# Patient Record
Sex: Male | Born: 1965
Health system: Southern US, Community
[De-identification: ages and names within clinical notes are randomized; demographics above are authoritative.]

## PROBLEM LIST (undated history)

## (undated) DIAGNOSIS — G8929 Other chronic pain: Secondary | ICD-10-CM

## (undated) DIAGNOSIS — Z860100 Personal history of colon polyps, unspecified: Secondary | ICD-10-CM

## (undated) DIAGNOSIS — M549 Dorsalgia, unspecified: Secondary | ICD-10-CM

## (undated) DIAGNOSIS — F411 Generalized anxiety disorder: Secondary | ICD-10-CM

## (undated) DIAGNOSIS — Z973 Presence of spectacles and contact lenses: Secondary | ICD-10-CM

## (undated) DIAGNOSIS — K5090 Crohn's disease, unspecified, without complications: Secondary | ICD-10-CM

## (undated) DIAGNOSIS — Z8719 Personal history of other diseases of the digestive system: Secondary | ICD-10-CM

## (undated) DIAGNOSIS — F32A Depression, unspecified: Secondary | ICD-10-CM

## (undated) DIAGNOSIS — L309 Dermatitis, unspecified: Secondary | ICD-10-CM

## (undated) DIAGNOSIS — D494 Neoplasm of unspecified behavior of bladder: Secondary | ICD-10-CM

## (undated) DIAGNOSIS — D860 Sarcoidosis of lung: Secondary | ICD-10-CM

## (undated) HISTORY — PX: BRONCHOSCOPY: SUR163

## (undated) HISTORY — DX: Crohn's disease, unspecified, without complications: K50.90

## (undated) HISTORY — PX: COLONOSCOPY: SHX174

## (undated) HISTORY — DX: Depression, unspecified: F32.A

---

## 2002-05-01 ENCOUNTER — Inpatient Hospital Stay (HOSPITAL_COMMUNITY): Admission: EM | Admit: 2002-05-01 | Discharge: 2002-05-03 | Payer: Self-pay | Admitting: Gastroenterology

## 2002-05-03 ENCOUNTER — Encounter: Payer: Self-pay | Admitting: Gastroenterology

## 2008-03-06 ENCOUNTER — Encounter: Payer: Self-pay | Admitting: Internal Medicine

## 2008-03-08 ENCOUNTER — Ambulatory Visit: Payer: Self-pay | Admitting: Internal Medicine

## 2008-03-08 DIAGNOSIS — R599 Enlarged lymph nodes, unspecified: Secondary | ICD-10-CM | POA: Insufficient documentation

## 2008-03-08 DIAGNOSIS — J329 Chronic sinusitis, unspecified: Secondary | ICD-10-CM | POA: Insufficient documentation

## 2008-03-08 DIAGNOSIS — R21 Rash and other nonspecific skin eruption: Secondary | ICD-10-CM | POA: Insufficient documentation

## 2008-03-15 ENCOUNTER — Ambulatory Visit: Payer: Self-pay | Admitting: Internal Medicine

## 2008-03-15 DIAGNOSIS — F17200 Nicotine dependence, unspecified, uncomplicated: Secondary | ICD-10-CM | POA: Insufficient documentation

## 2008-03-15 LAB — CONVERTED CEMR LAB
ALT: 26 units/L (ref 0–53)
AST: 20 units/L (ref 0–37)
Albumin: 4.2 g/dL (ref 3.5–5.2)
Alkaline Phosphatase: 172 units/L — ABNORMAL HIGH (ref 39–117)
Angiotensin 1 Converting Enzyme: 74 units/L — ABNORMAL HIGH (ref 9–67)
BUN: 9 mg/dL (ref 6–23)
Basophils Absolute: 0 10*3/uL (ref 0.0–0.1)
Basophils Relative: 0.8 % (ref 0.0–3.0)
Bilirubin, Direct: 0.1 mg/dL (ref 0.0–0.3)
CO2: 28 meq/L (ref 19–32)
Calcium: 9.6 mg/dL (ref 8.4–10.5)
Chloride: 104 meq/L (ref 96–112)
Creatinine, Ser: 0.9 mg/dL (ref 0.4–1.5)
Direct LDL: 128.2 mg/dL
Eosinophils Absolute: 0.2 10*3/uL (ref 0.0–0.7)
Eosinophils Relative: 3.8 % (ref 0.0–5.0)
GFR calc Af Amer: 120 mL/min
GFR calc non Af Amer: 99 mL/min
Glucose, Bld: 91 mg/dL (ref 70–99)
HCT: 38.9 % — ABNORMAL LOW (ref 39.0–52.0)
Hemoglobin: 13.8 g/dL (ref 13.0–17.0)
INR: 1.2 — ABNORMAL HIGH (ref 0.8–1.0)
Lymphocytes Relative: 15.1 % (ref 12.0–46.0)
MCHC: 35.4 g/dL (ref 30.0–36.0)
MCV: 86.8 fL (ref 78.0–100.0)
Monocytes Absolute: 0.4 10*3/uL (ref 0.1–1.0)
Monocytes Relative: 7.7 % (ref 3.0–12.0)
Neutro Abs: 3.5 10*3/uL (ref 1.4–7.7)
Neutrophils Relative %: 72.6 % (ref 43.0–77.0)
Platelets: 287 10*3/uL (ref 150–400)
Potassium: 4.1 meq/L (ref 3.5–5.1)
Prothrombin Time: 13.8 s — ABNORMAL HIGH (ref 10.9–13.3)
RBC: 4.48 M/uL (ref 4.22–5.81)
RDW: 12.4 % (ref 11.5–14.6)
Sed Rate: 47 mm/hr — ABNORMAL HIGH (ref 0–16)
Sodium: 137 meq/L (ref 135–145)
Total Bilirubin: 0.7 mg/dL (ref 0.3–1.2)
Total Protein: 8.7 g/dL — ABNORMAL HIGH (ref 6.0–8.3)
WBC: 4.8 10*3/uL (ref 4.5–10.5)
aPTT: 33.7 s — ABNORMAL HIGH (ref 21.7–29.8)

## 2008-03-18 ENCOUNTER — Telehealth (INDEPENDENT_AMBULATORY_CARE_PROVIDER_SITE_OTHER): Payer: Self-pay | Admitting: *Deleted

## 2008-03-19 ENCOUNTER — Ambulatory Visit: Payer: Self-pay | Admitting: Cardiology

## 2008-03-19 ENCOUNTER — Ambulatory Visit: Payer: Self-pay | Admitting: Internal Medicine

## 2008-03-22 ENCOUNTER — Ambulatory Visit: Admission: RE | Admit: 2008-03-22 | Discharge: 2008-03-22 | Payer: Self-pay | Admitting: Internal Medicine

## 2008-03-22 ENCOUNTER — Ambulatory Visit: Payer: Self-pay | Admitting: Internal Medicine

## 2008-03-22 ENCOUNTER — Encounter: Payer: Self-pay | Admitting: Internal Medicine

## 2008-03-27 ENCOUNTER — Ambulatory Visit: Payer: Self-pay | Admitting: Internal Medicine

## 2008-03-27 DIAGNOSIS — D869 Sarcoidosis, unspecified: Secondary | ICD-10-CM | POA: Insufficient documentation

## 2008-05-13 ENCOUNTER — Ambulatory Visit: Payer: Self-pay | Admitting: Internal Medicine

## 2008-05-29 ENCOUNTER — Telehealth: Payer: Self-pay | Admitting: Internal Medicine

## 2008-09-26 ENCOUNTER — Telehealth (INDEPENDENT_AMBULATORY_CARE_PROVIDER_SITE_OTHER): Payer: Self-pay | Admitting: *Deleted

## 2008-09-27 ENCOUNTER — Ambulatory Visit: Payer: Self-pay | Admitting: Internal Medicine

## 2008-10-07 ENCOUNTER — Telehealth: Payer: Self-pay | Admitting: Internal Medicine

## 2009-01-20 ENCOUNTER — Ambulatory Visit: Payer: Self-pay | Admitting: Gastroenterology

## 2009-02-18 ENCOUNTER — Telehealth: Payer: Self-pay | Admitting: Gastroenterology

## 2009-02-19 ENCOUNTER — Ambulatory Visit: Payer: Self-pay | Admitting: Gastroenterology

## 2009-02-19 DIAGNOSIS — K5090 Crohn's disease, unspecified, without complications: Secondary | ICD-10-CM

## 2009-02-19 HISTORY — DX: Crohn's disease, unspecified, without complications: K50.90

## 2009-02-20 ENCOUNTER — Telehealth: Payer: Self-pay | Admitting: Gastroenterology

## 2009-03-05 ENCOUNTER — Telehealth: Payer: Self-pay | Admitting: Gastroenterology

## 2009-03-07 ENCOUNTER — Telehealth (INDEPENDENT_AMBULATORY_CARE_PROVIDER_SITE_OTHER): Payer: Self-pay | Admitting: *Deleted

## 2010-11-17 NOTE — Consult Note (Signed)
NAMEZAION, HREHA                  ACCOUNT NO.:  [id number]   MEDICAL RECORD NO.:  [id number]          PATIENT TYPE:  AMB   LOCATION:  CARD                         FACILITY:  [facility]   PHYSICIAN:  Clinton D. Annamaria Boots, MD, FCCP, FACPDATE OF BIRTH:  08-25-67   DATE OF CONSULTATION:  03/22/2008  DATE OF DISCHARGE:                                 CONSULTATION   INDICATION FOR PROCEDURE:  A 45 year old smoker with persistent cough.  Chest x-ray evidence of mediastinal and hilar adenopathy.  Reticular  nodular parenchymal infiltrates with elevated ACE level.  Bronchoscopy  is performed to establish diagnosis of suspected sarcoid and to rule out  additional diagnoses.  Detailed H&P is attached to the record.   DESCRIPTION OF PROCEDURE:  After fully informed consent, bronchoscopy  was performed on an outpatient basis in the endoscopy suite.  Because of  old nasal trauma, an oral bite block was used.  Oxygen was provided and  titrated to hold saturations 90-98%.  EKG showed sinus rhythm and sinus  tachycardia to 126.  A cumulative dose of 6 mg of Versed and 50 mg of  intravenous fentanyl was used for additional sedation and cough control  providing minimal sedation for this pleasant but active and talkative  gentleman.  The upper airway is anesthetized topically with 2%  Xylocaine.  A fiberoptic videoscope was advanced via the oral appliance  without difficulty.  Cords, trachea and carina look normal.  Sequential  examination was performed bilaterally to the fourth division level with  no endobronchial lesions seen.  Minimal bronchitis may have been present  but any changes were very slight.  Secretions were clear and mucoid.  With fluoroscopic guidance, the bronchoscope was directed into the right  upper lobe where washing, brushing and then transbronchial lung biopsies  were obtained by standard technique from the anterior and posterior  segments.  A biopsy sample was also obtained from the  lingula of the  left lung.  There were no apparent complications.  He was alert and  conversant following the procedure, asking appropriate questions.  He  will be held until stable and then released home with family to  outpatient followup.   FINAL IMPRESSION:  Findings consistent with sarcoid but nonspecific  pending biopsy return.      Clinton D. Annamaria Boots, MD, Shade Flood, FACP  Electronically Signed     CDY/MEDQ  D:  03/22/2008  T:  03/25/2008  Job:  [id number]   cc:   Richardson Landry A. Everlene Farrier, M.D.  Fax: [phone]

## 2010-11-20 NOTE — H&P (Signed)
NAME:  Derrick Herrera, Derrick Herrera                            ACCOUNT NO.:  [id number]   MEDICAL RECORD NO.:  [id number]                   PATIENT TYPE:  INP   LOCATION:  2878                                 FACILITY:  [facility]   PHYSICIAN:  Malcolm T. Fuller Plan, M.D. [facility]          DATE OF BIRTH:  14-May-1966   DATE OF ADMISSION:  05/01/2002  DATE OF DISCHARGE:                                HISTORY & PHYSICAL   CHIEF COMPLAINT:  Rectal bleeding.   HISTORY OF PRESENT ILLNESS:  The patient is a pleasant 45 year old white  male with a history of Crohn's disease, initially diagnosed in 45. He was  at that time found to have an abnormal ileocecal valve on colonoscopy per  Dr. Fuller Plan. Biopsies were consistent with Crohn's disease. He also was  hospitalized in 1995 with an acute lower GI bleed and underwent repeat  colonoscopy at that time, which was unremarkable with the exception of a  stenotic ileocecal valve. He then had subsequent small bowel follow through  with findings consistent with Crohn's involvement of the distal ileum and a  probable ulceration and a long segment of involvement extending  approximately 15 cm within the ileum. The patient did not require  transfusion with that bleed. He has been seen sporadically per Dr. Arnoldo Morale  since that time and has not been seen by Dr. Fuller Plan since 1995. The patient  had remained on a maintenance dose of Pentasa, generally six tablets per  day, but says that some days he does not get that much in.  He says he had  been doing well recently without any obvious Crohn's symptomatology. He says  occasionally he gets a little abdominal gas or cramping, but this has not  been on any sort of regular basis. He says his bowel movements have been  normal. His energy level  has been good, appetite fine and his weight has  been stable. Interestingly he is trying to quit  smoking  and has been off  nicotine over the past few days. He is not taking any regular aspirin or  NSAIDS.   The patient had abrupt onset yesterday afternoon at about 3:30 p.m. with  bloody bowel movements. He says he started passing bright red blood and  clots per rectum without any obvious stool. He had 8 to 10 episodes after  onset, the last one about 11:30 this morning. He was seen initially by Dr.  Arnoldo Morale, and found to have a hemoglobin of 11.4, and then referred to GI for  urgent evaluation. He was seen and evaluated in the office by Dr. Fuller Plan and  found to be hemodynamically stable, but with bright red blood on rectal  exam. He is admitted to the hospital for observation, transfusion as  indicated and further diagnostic  evaluation. Suspicion at this time is that  of a colonic or ileal ulcer with hemorrhage secondary to  reactivation of  his  Crohn's.   CURRENT MEDICATIONS:  Pentasa 250 mg 2 p.o. t.i.d.  No other regular  medicines.   ALLERGIES:  No known drug allergies.   PAST MEDICAL HISTORY:  Benign other than Crohn's disease. He has not had any  other prior surgeries or hospitalizations.   SOCIAL HISTORY:  The patient is single. 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 He is a smoker, trying to quit over the  past several  days. No regular ETOH.   FAMILY HISTORY:  Pertinent for a grandmother with polyps. Mother with  ulcerative colitis and one uncle with colon polyps.   REVIEW OF SYSTEMS:  Reviewed and entirely negative other than GI as outlined  above.   PHYSICAL EXAMINATION:  GENERAL:  A well developed, healthy appearing 56-year-  old white male in  no acute distress. Alert and oriented x 3. Weight 198.  SKIN:  Warm and dry.  VITAL SIGNS:  Temperature 98.6, blood pressure 124/80, pulse 92.  HEENT:  Normocephalic, atraumatic.  EOMI, PERRLA.  Sclerae anicteric.  NECK:  Supple without nodes.  CARDIOVASCULAR:  Slightly tachy, regular rhythm with S1 and S2, no murmurs,  rubs, gallops.  PULMONARY:  Clear to auscultation and percussion.   ABDOMEN:  Soft, nontender. There is no palpable mass or hepatosplenomegaly.  RECTAL:  Per Dr. Fuller Plan revealed a small amount of  frank blood, no lesion  noted.  EXTREMITIES:  Without cyanosis, clubbing or edema.  NEUROLOGIC:  Grossly nonfocal.   LABORATORY DATA:  Laboratory studies are pending. Hemoglobin earlier today  11.4. Labs reviewed from July 2003 done per Dr. Arnoldo Morale showing a hemoglobin  of 13.5, hematocrit 41.1, MCV 80.6, platelet count 211, white count at that  time was 2.4. Liver function studies normal. Alkaline phosphatase 119. TSH  0.79.   IMPRESSION:  44. A 45 year old male with a history of Chron's ileitis with acute lower GI     bleed, rule out colonic or ileal ulceration secondary to inflammatory     bowel disease with hemorrhage. Consider Meckel's diverticulum.  2. History of acute lower GI bleed in 1995 with active Crohn's of the     terminal ileum, but no discrete lesion found to account for bleeding at     that time.   PLAN:  The patient is admitted to the service of Dr. Lucio Edward. He will  be placed on IV fluids, serial H&Hs, transfuse as indicated for hemoglobin  less than  9. Will check baseline labs. Plan colonoscopy within the next 24  hours. If this is unremarkable will need a small bowel follow through  and a  Meckel scan. For details please see the orders.     Amy Esterwood, P.A.-C. [place] T. Fuller Plan, M.D. [facility]    AE/MEDQ  D:  05/01/2002  T:  05/01/2002  Job:  [id number]   cc:   Dr. Fritzi Mandes

## 2010-11-20 NOTE — Discharge Summary (Signed)
NAME:  OSBORNE, SERIO                            ACCOUNT NO.:  [id number]   MEDICAL RECORD NO.:  [id number]                   PATIENT TYPE:  INP   LOCATION:  6962                                 FACILITY:  [facility]   PHYSICIAN:  Sandy Salaam. Deatra Ina, M.D. [facility]          DATE OF BIRTH:  08/24/1965   DATE OF ADMISSION:  05/01/2002  DATE OF DISCHARGE:  05/03/2002                                 DISCHARGE SUMMARY   ADMISSION DIAGNOSES:  61. A 45 year old white male with a history of Crohn's ileitis with acute     lower gastrointestinal bleed, rule out colonic or ileal ulceration     secondary to inflammatory bowel disease with hemorrhage, consider     Meckel's diverticulum.  2. History of acute lower gastrointestinal bleed in 1995, with active     Crohn's of the terminal ileum, but no discrete lesion found to account     for bleeding at that time.   DISCHARGE DIAGNOSES:  59. A 45 year old male with acute lower gastrointestinal bleed, self-limited,     secondary to active ileitis.  2. Anemia, normocytic, secondary to above.  3. Anxiety.   CONSULTATIONS:  None.   PROCEDURE:  Colonoscopy per Dr. Deatra Ina, and small bowel follow through.   HISTORY OF PRESENT ILLNESS:  The patient is a pleasant 45 year old white  male known to Dr. Benay Pillow and Dr. Fuller Plan with a history of Crohn's  disease initially diagnosed in 30.  At that time, he was found to have an  abnormal ileocecal valve per Dr. Fuller Plan.  Biopsies were consistent with  Crohn's.  He was then hospitalized in 1995 with an acute lower  gastrointestinal bleed and underwent repeat colonoscopy which showed a  stenotic ileocecal valve, and was otherwise negative.  Subsequent small  bowel follow through showed Crohn's involvement of the distal ileum, and a  long segment of involvement extending approximately 15 cm into the ileum.  The patient did not require transfusion with that bleed.  He has since been  seen sporadically by Dr. Arnoldo Morale and not  by Dr. Fuller Plan since 1995.  He has  remained on a maintenance dose of Pentasa, generally six tablets per day,  but says that he does decrease the dose when he is doing well.  He says he  had been doing well recently without any obvious Crohn's symptomatology,but  has ongoing periodic abdominal distention and bloating.  His bowel movements  have been normal.  Energy level had been good.  Appetite fine, and his  weight has been stable.  He is trying to quit smoking, and has not had any  cigarettes in three days at the onset of his bleed.  He had not been on any  regular aspirin or NSAIDS.   The patient had abrupt onset at about 3:30 p.m. on the afternoon prior to  admission with bloody bowel movements and started passing bright red blood  and  clots.  He had 8 to 10 episodes of this after onset, the last one at  about 11:30 a.m. on the morning of admission.  He was seen by Dr. Arnoldo Morale,  initially found to have a hemoglobin of 11.4, and then referred to GI for  further evaluation.  He was seen by Dr. Fuller Plan, felt to be hemodynamically  stable, but with bright red blood on rectal examination, was admitted to the  hospital for observation, transfusion as needed, and further diagnostic  evaluation.   LABORATORY DATA:  Again on admission on 05/01/02, white blood cell count  5.5, hemoglobin 11.6, hematocrit 33.5, MCV 85.8, platelets 267.  Followup on  05/02/02, hemoglobin 10.7, hematocrit 30.4, later that day 9.2 and 26.8,  26.6.  Following that, 8.9 and 25.7.  On 05/03/02, hemoglobin 9.3 and 27.8.  Second value 9.2 and hematocrit of 26.9.  Sedimentation rate normal at 20.  Prothrombin time 14, INR 1.1, PTT of 28.  Electrolytes were within normal  limits.  Liver function tests were within normal limits.  Albumin of 3.6.  The patient did not require transfusion this admission.   HOSPITAL COURSE:  The patient was admitted to the service of Dr. Lucio Edward.  He was initially put on a clear liquid  diet, continued on Pentasa,  and underwent a bowel prep.  He then had colonoscopy with Dr. Deatra Ina on  05/02/02, with finding of a normal colon.  The terminal ileum was not  intubated.  The patient was then scheduled for a small bowel follow through  which was done on 05/03/02, with finding of a 12 to 15 cm segment of active  disease in the ileum.  The terminal ileum was felt to be normal, and this  segment was up into the ileum approximately 10 cm from the ileocecal valve.  There was a suggestion of a polypoid like defects, no stricture.  The  patient at that point had not bled over the last 24 hours, was feeling well,  hemoglobin had stabilized, and he was allowed discharge to home with  instructions to follow up with Dr. Deatra Ina on Wednesday, 05/16/02 at 3:30  p.m.  He was to avoid all aspirin and anti-inflammatories.   ACTIVITY:  Limit his activity.   DIET:  Regular as tolerated.   DISCHARGE MEDICATIONS:  1. Pentasa 250 mg three p.o. q.i.d.  2. Entocort 3 mg three p.o. q.d.  3. Nu-Iron one p.o. b.i.d.  4. Xanax 0.25 mg b.i.d. p.r.n.   He was to notify us for any recurrence of bleeding.      Nicoletta Ba, P.A.-C. [facility]                Robert D. Deatra Ina, M.D. [facility]    AE/MEDQ  D:  05/03/2002  T:  05/03/2002  Job:  [id number]   cc:   Ricard Dillon, M.D. [facility]

## 2011-04-05 LAB — CULTURE, RESPIRATORY W GRAM STAIN

## 2011-04-05 LAB — FUNGUS CULTURE W SMEAR: Fungal Smear: NONE SEEN

## 2011-04-05 LAB — AFB STAIN: Acid Fast Smear: NONE SEEN

## 2011-04-05 LAB — AFB CULTURE WITH SMEAR (NOT AT ARMC)

## 2015-07-30 ENCOUNTER — Ambulatory Visit: Payer: Self-pay | Admitting: Licensed Clinical Social Worker

## 2015-10-06 DIAGNOSIS — K5090 Crohn's disease, unspecified, without complications: Secondary | ICD-10-CM | POA: Diagnosis not present

## 2015-10-06 DIAGNOSIS — Z720 Tobacco use: Secondary | ICD-10-CM | POA: Diagnosis not present

## 2015-10-06 DIAGNOSIS — R05 Cough: Secondary | ICD-10-CM | POA: Diagnosis not present

## 2015-10-09 DIAGNOSIS — F4321 Adjustment disorder with depressed mood: Secondary | ICD-10-CM | POA: Diagnosis not present

## 2015-11-05 ENCOUNTER — Encounter: Payer: Self-pay | Admitting: Gastroenterology

## 2015-11-21 DIAGNOSIS — F4321 Adjustment disorder with depressed mood: Secondary | ICD-10-CM | POA: Diagnosis not present

## 2015-11-28 DIAGNOSIS — K5080 Crohn's disease of both small and large intestine without complications: Secondary | ICD-10-CM | POA: Diagnosis not present

## 2015-11-28 DIAGNOSIS — L821 Other seborrheic keratosis: Secondary | ICD-10-CM | POA: Diagnosis not present

## 2015-11-28 DIAGNOSIS — E782 Mixed hyperlipidemia: Secondary | ICD-10-CM | POA: Diagnosis not present

## 2016-01-09 DIAGNOSIS — F4321 Adjustment disorder with depressed mood: Secondary | ICD-10-CM | POA: Diagnosis not present

## 2016-02-04 ENCOUNTER — Encounter: Payer: Self-pay | Admitting: *Deleted

## 2016-02-05 ENCOUNTER — Other Ambulatory Visit: Payer: Self-pay | Admitting: *Deleted

## 2016-02-05 ENCOUNTER — Encounter (INDEPENDENT_AMBULATORY_CARE_PROVIDER_SITE_OTHER): Payer: Self-pay

## 2016-02-05 ENCOUNTER — Encounter: Payer: Self-pay | Admitting: Gastroenterology

## 2016-02-05 ENCOUNTER — Ambulatory Visit (INDEPENDENT_AMBULATORY_CARE_PROVIDER_SITE_OTHER): Payer: BLUE CROSS/BLUE SHIELD | Admitting: Gastroenterology

## 2016-02-05 VITALS — BP 120/76 | HR 119 | Ht 73.0 in | Wt 226.5 lb

## 2016-02-05 DIAGNOSIS — K5000 Crohn's disease of small intestine without complications: Secondary | ICD-10-CM

## 2016-02-05 MED ORDER — NA SULFATE-K SULFATE-MG SULF 17.5-3.13-1.6 GM/177ML PO SOLN: 1.0000 | Freq: Once | ORAL | 0 refills | Status: AC

## 2016-02-05 NOTE — Progress Notes (Signed)
02/05/2016 Derrick Herrera [id number] Jun 22, 1966   HISTORY OF PRESENT ILLNESS:  This is a 50 year old male with history of Crohn's ileitis. Previously used to see Dr. Fuller Plan and then got switched to Dr. Deatra Ina. Was last seen here in 2010. Says that he used to travel and move around a lot for work. He has now moved back to the area permanently and needs to reestablish care. He had his last colonoscopy here in 2010 and at that time the study was normal. Was found have ileitis on small bowel imaging. Says that he had been treated with Pentasa which he took for quite some time and at some point he stopped taking that. Had also been treated with Entocort for some time as well he was living in [place]. Now most recently had a PCP who would treat him for flares a couple times a year with some quick tapers of prednisone. Currently is not having any issues. He says that his Crohn's flares consist of episodes of vomiting, abdominal pain/distention, and diarrhea. Never sees blood in his stool. On a regular basis he has a normal bowel movement daily.  Past Medical History:  Diagnosis Date  . Crohn's disease ([place]) 02/19/2009   Indication on colonoscopy dated 02/19/2009   Past Surgical History:  Procedure Laterality Date  . BRONCHOSCOPY  2009  . COLONOSCOPY  2010    reports that he has been smoking E-cigarettes.  He has never used smokeless tobacco. He reports that he drinks alcohol. He reports that he does not use drugs. family history includes Colon cancer in his maternal grandmother; Diabetes in his mother. Allergies  Allergen Reactions  . Nsaids       Outpatient Encounter Prescriptions as of 02/05/2016  Medication Sig  . buPROPion (WELLBUTRIN XL) 300 MG 24 hr tablet Take by mouth.  . clonazePAM (KLONOPIN) 0.5 MG tablet Take by mouth.  . hyoscyamine (LEVSIN SL) 0.125 MG SL tablet Take by mouth.  . ondansetron (ZOFRAN-ODT) 4 MG disintegrating tablet Take by mouth.  . predniSONE (DELTASONE) 20 MG  tablet Take by mouth.  . promethazine (PHENERGAN) 25 MG suppository Place rectally.   No facility-administered encounter medications on file as of 02/05/2016.      REVIEW OF SYSTEMS  : All other systems reviewed and negative except where noted in the History of Present Illness.   PHYSICAL EXAM: BP 120/76   Pulse (!) 119   Ht 6' 1"  (1.854 m)   Wt 226 lb 8 oz (102.7 kg)   BMI 29.88 kg/m  General: Well developed white male in no acute distress Head: Normocephalic and atraumatic Eyes:  Sclerae anicteric, conjunctiva pink. Ears: Normal auditory acuity Lungs: Clear throughout to auscultation Heart: Regular rate and rhythm Abdomen: Soft, non-distended.  Normal bowel sounds.  Non-tender. Rectal:  Will be done at the time of colonoscopy. Musculoskeletal: Symmetrical with no gross deformities  Skin: No lesions on visible extremities Extremities: No edema  Neurological: Alert oriented x 4, grossly non-focal Psychological:  Alert and cooperative. Normal mood and affect  ASSESSMENT AND PLAN: -50 year old male with history of Crohn's ileitis:  Has not had any routine GI care in about 7 years due to traveling for work and moving around, etc. Self medicates intermittently for flares with prednisone. Not currently having any issues but may only have insurance for the month of August. We'll proceed with repeat colonoscopy at this time. I think that we need to do some reevaluation and reassess his condition. He may also need some  repeat small bowel imaging. I'm going to do labs including CBC, CMP, TSH, sedimentation rate, CRP.  The risks, benefits, and alternatives to colonoscopy were discussed with the patient and he consents to proceed.   CC:  No ref. provider found

## 2016-02-05 NOTE — Patient Instructions (Addendum)
Please go to the basement level to have your labs drawn.  We sent a prescription to [address]for the colonoscopy prep.  You have been scheduled for a colonoscopy. Please follow written instructions given to you at your visit today.  Please pick up your prep supplies at the pharmacy within the next 1-3 days. If you use inhalers (even only as needed), please bring them with you on the day of your procedure. Your physician has requested that you go to www.startemmi.com and enter the access code given to you at your visit today. This web site gives a general overview about your procedure. However, you should still follow specific instructions given to you by our office regarding your preparation for the procedure.

## 2016-02-06 DIAGNOSIS — F4321 Adjustment disorder with depressed mood: Secondary | ICD-10-CM | POA: Diagnosis not present

## 2016-02-07 NOTE — Progress Notes (Signed)
Agree with assessment and plan as outlined.  

## 2016-02-10 DIAGNOSIS — M5432 Sciatica, left side: Secondary | ICD-10-CM | POA: Diagnosis not present

## 2016-02-11 DIAGNOSIS — M5432 Sciatica, left side: Secondary | ICD-10-CM | POA: Diagnosis not present

## 2016-02-12 ENCOUNTER — Encounter: Payer: Self-pay | Admitting: Gastroenterology

## 2016-02-13 DIAGNOSIS — M5432 Sciatica, left side: Secondary | ICD-10-CM | POA: Diagnosis not present

## 2016-02-18 ENCOUNTER — Encounter: Payer: Self-pay | Admitting: Gastroenterology

## 2016-02-20 DIAGNOSIS — M5432 Sciatica, left side: Secondary | ICD-10-CM | POA: Diagnosis not present

## 2016-02-23 ENCOUNTER — Encounter: Payer: Self-pay | Admitting: Medical

## 2016-02-23 ENCOUNTER — Ambulatory Visit (INDEPENDENT_AMBULATORY_CARE_PROVIDER_SITE_OTHER): Payer: BLUE CROSS/BLUE SHIELD | Admitting: Medical

## 2016-02-23 ENCOUNTER — Ambulatory Visit (HOSPITAL_BASED_OUTPATIENT_CLINIC_OR_DEPARTMENT_OTHER)
Admission: RE | Admit: 2016-02-23 | Discharge: 2016-02-23 | Disposition: A | Discharge status: Home / Self Care | Payer: BLUE CROSS/BLUE SHIELD | Source: Ambulatory Visit | Attending: Medical | Admitting: Medical

## 2016-02-23 VITALS — BP 128/80 | HR 95 | Temp 97.6°F | Ht 73.0 in | Wt 229.4 lb

## 2016-02-23 DIAGNOSIS — R000 Tachycardia, unspecified: Secondary | ICD-10-CM

## 2016-02-23 DIAGNOSIS — Z01818 Encounter for other preprocedural examination: Secondary | ICD-10-CM

## 2016-02-23 DIAGNOSIS — Z720 Tobacco use: Secondary | ICD-10-CM

## 2016-02-23 DIAGNOSIS — R918 Other nonspecific abnormal finding of lung field: Secondary | ICD-10-CM | POA: Diagnosis not present

## 2016-02-23 DIAGNOSIS — M5126 Other intervertebral disc displacement, lumbar region: Secondary | ICD-10-CM | POA: Diagnosis not present

## 2016-02-23 DIAGNOSIS — Z87891 Personal history of nicotine dependence: Secondary | ICD-10-CM

## 2016-02-23 DIAGNOSIS — K50919 Crohn's disease, unspecified, with unspecified complications: Secondary | ICD-10-CM

## 2016-02-23 LAB — COMPREHENSIVE METABOLIC PANEL
ALBUMIN: 4.2 g/dL (ref 3.5–5.2)
ALT: 39 U/L (ref 0–53)
AST: 21 U/L (ref 0–37)
Alkaline Phosphatase: 111 U/L (ref 39–117)
BUN: 9 mg/dL (ref 6–23)
CHLORIDE: 103 meq/L (ref 96–112)
CO2: 29 meq/L (ref 19–32)
CREATININE: 0.8 mg/dL (ref 0.40–1.50)
Calcium: 9.1 mg/dL (ref 8.4–10.5)
GFR: 109.53 mL/min (ref >60.00)
GLUCOSE: 100 mg/dL — AB (ref 70–99)
Potassium: 3.9 mEq/L (ref 3.5–5.1)
SODIUM: 139 meq/L (ref 135–145)
Total Bilirubin: 0.3 mg/dL (ref 0.2–1.2)
Total Protein: 7.1 g/dL (ref 6.0–8.3)

## 2016-02-23 LAB — CBC WITH DIFFERENTIAL/PLATELET
BASOS PCT: 0.3 % (ref 0.0–3.0)
Basophils Absolute: 0 10*3/uL (ref 0.0–0.1)
EOS ABS: 0.1 10*3/uL (ref 0.0–0.7)
Eosinophils Relative: 1.7 % (ref 0.0–5.0)
HCT: 38.5 % — ABNORMAL LOW (ref 39.0–52.0)
HEMOGLOBIN: 13.3 g/dL (ref 13.0–17.0)
LYMPHS ABS: 1 10*3/uL (ref 0.7–4.0)
Lymphocytes Relative: 17.6 % (ref 12.0–46.0)
MCHC: 34.4 g/dL (ref 30.0–36.0)
MCV: 89.1 fl (ref 78.0–100.0)
MONO ABS: 0.4 10*3/uL (ref 0.1–1.0)
Monocytes Relative: 6 % (ref 3.0–12.0)
NEUTROS PCT: 74.4 % (ref 43.0–77.0)
Neutro Abs: 4.4 10*3/uL (ref 1.4–7.7)
PLATELETS: 224 10*3/uL (ref 150.0–400.0)
RBC: 4.32 Mil/uL (ref 4.22–5.81)
RDW: 13 % (ref 11.5–15.5)
WBC: 5.9 10*3/uL (ref 4.0–10.5)

## 2016-02-23 LAB — TSH: TSH: 0.88 u[IU]/mL (ref 0.35–4.50)

## 2016-02-23 NOTE — Patient Instructions (Addendum)
For your preop evaluation will get cbc and cmp today.(Include tsh for mild fast hr)  We did ekg today. It looks ok except mild high. This may be due to admitted nervousness and your back pain. In light of your recent neurologic exam and classification as urgent surgery, I  think reasonable to clear you for surgery  provided blood work looks good.  For history of smoking will go ahead and get chest xray.  Follow up with GI later this year after healed from back surgery.  Regarding you mild sweating and increased hr I also need to advise that it may be best to stop either trazadone or wellbutrin today. There is rare syndrome called serotonin  syndrome that can occur. So maybe would be best to stop wellbutrin now. Then maybe resume later after discussion.  Follow up with me 2-3 weeks as well. At that point want to recheck pulse and see how doing overall. May also in near future need CPE.

## 2016-02-23 NOTE — Progress Notes (Addendum)
Subjective:    Patient ID: Derrick Herrera, male    DOB: [birth year], 50 y.o.   MRN: [id number]  HPI   I have reviewed pt PMH, PSH, FH, Social History and Surgical History.  Pt in for first time. [place] has been [place] of [place] 10-12 years. He used to be pt at [facility].  Pt states he has history of back pain for 6 weeks. He finally got in with spinal surgeon. They did mri and told herniated/buldging disc and has surgery planned for this Friday. Pain is moderate. Pt has sharp pain that runs down bottom of his his left foot.  Pt has colonoscopy on August 31 st. Pt does have history of chron's disease.   Pt is on Wellbutrin. He initially tried to take for smoking cessation. Did not take but felt better on med. He may try to quit again.  Pt is clonopin that he takes prior to [employer].   Pt states he would like a pre-op evaluation although his surgeon indicated to him he was willing to surgery without official pre-op since pt has some left foot drop.            Review of Systems  Constitutional: Negative for chills, fatigue and fever.  Respiratory: Negative for cough, chest tightness, shortness of breath and wheezing.   Cardiovascular: Negative for chest pain and palpitations.  Gastrointestinal: Negative for abdominal pain.  Musculoskeletal: Positive for back pain. Negative for neck pain.  Skin: Negative for rash.  Neurological: Positive for numbness. Negative for dizziness and light-headedness.       Some reported numbness lateral aspect of his leg. He states heel gait poor at neurosurgeon office.  No incontinence. Not foot drop presently.  No saddle anesthesia  Hematological: Negative for adenopathy. Does not bruise/bleed easily.  Psychiatric/Behavioral: Negative for behavioral problems and confusion.   Past Medical History:  Diagnosis Date  . Crohn's disease ([place]) 02/19/2009   Indication on colonoscopy dated 02/19/2009     Social History   Social History  . Marital  status: Single    Spouse name: N/A  . Number of children: N/A  . Years of education: N/A   Occupational History  . Not on file.   Social History Main Topics  . Smoking status: Current Every Day Smoker    Types: E-cigarettes  . Smokeless tobacco: Never Used  . Alcohol use Yes     Comment: occ  . Drug use: No  . Sexual activity: No   Other Topics Concern  . Not on file   Social History Narrative  . No narrative on file    Past Surgical History:  Procedure Laterality Date  . BRONCHOSCOPY  2009  . COLONOSCOPY  2010    Family History  Problem Relation Age of Onset  . Colon cancer Maternal Grandmother   . Diabetes Mother     Allergies  Allergen Reactions  . Nsaids     Current Outpatient Prescriptions on File Prior to Visit  Medication Sig Dispense Refill  . buPROPion (WELLBUTRIN XL) 300 MG 24 hr tablet Take by mouth.    . clonazePAM (KLONOPIN) 0.5 MG tablet Take by mouth.    . hyoscyamine (LEVSIN SL) 0.125 MG SL tablet Take by mouth.    . ondansetron (ZOFRAN-ODT) 4 MG disintegrating tablet Take by mouth.    . predniSONE (DELTASONE) 20 MG tablet Take by mouth.    . promethazine (PHENERGAN) 25 MG suppository Place rectally.     No current facility-administered  medications on file prior to visit.     BP 128/80   Pulse 95 Comment: varied 95-112 today during exam  Temp 97.6 F (36.4 C) (Oral)   Ht 6' 1"  (1.854 m)   Wt 229 lb 6.4 oz (104.1 kg)   SpO2 98%   BMI 30.27 kg/m        Objective:   Physical Exam  General Appearance- Not in acute distress.  Neck- no jvd. No carotid bruits.   Chest and Lung Exam Auscultation: Breath sounds:-Normal. Clear even and unlabored. Adventitious sounds:- No Adventitious sounds.  Cardiovascular Auscultation:Rythm - Regular, rate and rythm. Heart Sounds -Normal heart sounds.  Abdomen Inspection:-Inspection Normal.  Palpation/Perucssion: Palpation and Percussion of the abdomen reveal- Non Tender, No Rebound  tenderness, No rigidity(Guarding) and No Palpable abdominal masses.  Liver:-Normal.  Spleen:- Normal.   Back Mid lumbar spine tenderness to palpation. Pain on lateral movements and flexion/extension of the spine.  Lower ext neurologic  L5-S1 sensation intact bilaterally. Normal patellar reflexes bilaterally. No foot drop bilaterally.(but poor walking on heels. Can't do so on left side.)      Assessment & Plan:  ekg showed sinus tachycardia at 101. At resting prior to ekg was 92 by o2 sat machine then pulse increased when ran ekg.   For your preop evaluation will get cbc and cmp today.  We did ekg today. It looks ok except mild high. This may be due to admitted nervousness and your back pain. In light of your recent neurologic exam and classification as urgent surgery, I think reasonable to clear provided blood work looks good.  For history of smoking will go ahead and get chest xray.  Follow up with GI later this year after healed from back surgery.  Regarding you mild sweating and increased hr I also need to advise that it may be best to stop either trazadone or wellbutrin today. There is rare syndrome called serotonin syndrome that can occur. So maybe would be best to stop wellbutrin now. Then maybe resume later after discussion.  Follow up with me 2-3 weeks as well. At that point want to recheck pulse and see how doing overall. May also in near future need CPE.  Note labs, ekg and cxr reviewed. Pt has no respiratory symptoms recently. I did go ahead and write letter clearing for surgery. Staff member faxed to [facility]. I placed copy of letter to be scanned.

## 2016-02-23 NOTE — Progress Notes (Signed)
Pre visit review using our clinic tool,if applicable. No additional management support is needed unless otherwise documented below in the visit note.  

## 2016-02-24 ENCOUNTER — Telehealth: Payer: Self-pay | Admitting: Medical

## 2016-02-24 NOTE — Telephone Encounter (Signed)
I did call pt to discuss labs.left message asking him to call back today before noon. If not then will talk with him tomorrow.

## 2016-02-25 ENCOUNTER — Ambulatory Visit: Payer: BLUE CROSS/BLUE SHIELD | Admitting: Family Medicine

## 2016-02-25 ENCOUNTER — Telehealth: Payer: Self-pay | Admitting: Medical

## 2016-02-25 NOTE — Telephone Encounter (Signed)
Printed pre-op/surgery clearance letter.

## 2016-02-27 DIAGNOSIS — M5126 Other intervertebral disc displacement, lumbar region: Secondary | ICD-10-CM | POA: Diagnosis not present

## 2016-02-27 HISTORY — PX: LUMBAR DISC SURGERY: SHX700

## 2016-03-04 ENCOUNTER — Encounter: Payer: Self-pay | Admitting: Gastroenterology

## 2016-03-15 ENCOUNTER — Ambulatory Visit (HOSPITAL_BASED_OUTPATIENT_CLINIC_OR_DEPARTMENT_OTHER)
Admission: RE | Admit: 2016-03-15 | Discharge: 2016-03-15 | Disposition: A | Discharge status: Home / Self Care | Payer: BLUE CROSS/BLUE SHIELD | Source: Ambulatory Visit | Attending: Medical | Admitting: Medical

## 2016-03-15 ENCOUNTER — Encounter: Payer: Self-pay | Admitting: Medical

## 2016-03-15 ENCOUNTER — Ambulatory Visit: Payer: BLUE CROSS/BLUE SHIELD | Admitting: Medical

## 2016-03-15 ENCOUNTER — Ambulatory Visit (INDEPENDENT_AMBULATORY_CARE_PROVIDER_SITE_OTHER): Payer: BLUE CROSS/BLUE SHIELD | Admitting: Medical

## 2016-03-15 VITALS — BP 132/86 | HR 90 | Temp 97.8°F | Ht 73.0 in | Wt 227.4 lb

## 2016-03-15 DIAGNOSIS — G4700 Insomnia, unspecified: Secondary | ICD-10-CM | POA: Diagnosis not present

## 2016-03-15 DIAGNOSIS — G609 Hereditary and idiopathic neuropathy, unspecified: Secondary | ICD-10-CM

## 2016-03-15 DIAGNOSIS — R05 Cough: Secondary | ICD-10-CM

## 2016-03-15 DIAGNOSIS — Z87891 Personal history of nicotine dependence: Secondary | ICD-10-CM

## 2016-03-15 DIAGNOSIS — R059 Cough, unspecified: Secondary | ICD-10-CM

## 2016-03-15 DIAGNOSIS — Z720 Tobacco use: Secondary | ICD-10-CM | POA: Insufficient documentation

## 2016-03-15 DIAGNOSIS — M5440 Lumbago with sciatica, unspecified side: Secondary | ICD-10-CM

## 2016-03-15 DIAGNOSIS — F419 Anxiety disorder, unspecified: Secondary | ICD-10-CM | POA: Diagnosis not present

## 2016-03-15 MED ORDER — CLONAZEPAM 0.5 MG PO TABS: 0.5000 mg | ORAL_TABLET | Freq: Two times a day (BID) | ORAL | 0 refills | Status: DC

## 2016-03-15 MED ORDER — BUPROPION HCL ER (SR) 150 MG PO TB12: 150.0000 mg | ORAL_TABLET | Freq: Two times a day (BID) | ORAL | 0 refills | Status: DC

## 2016-03-15 MED ORDER — DOXYCYCLINE HYCLATE 100 MG PO TABS: 100.0000 mg | ORAL_TABLET | Freq: Two times a day (BID) | ORAL | 0 refills | Status: DC

## 2016-03-15 MED ORDER — HYDROXYZINE HCL 25 MG PO TABS: ORAL_TABLET | ORAL | 0 refills | Status: DC

## 2016-03-15 MED ORDER — CLONAZEPAM 0.5 MG PO TABS: ORAL_TABLET | ORAL | 0 refills | Status: DC

## 2016-03-15 MED ORDER — BENZONATATE 100 MG PO CAPS: 100.0000 mg | ORAL_CAPSULE | Freq: Three times a day (TID) | ORAL | 0 refills | Status: DC | PRN

## 2016-03-15 MED FILL — clonazePAM 0.5 MG TABS: 0.5 | 15 days supply | Qty: 30 | Fill #0

## 2016-03-15 MED FILL — BENZONATATE 100 MG CAPSULE: 100 | 10 days supply | Qty: 30 | Fill #0

## 2016-03-15 MED FILL — hydrOXYzine HCL 25 MG TABS: 25 | 30 days supply | Qty: 30 | Fill #0

## 2016-03-15 MED FILL — DOXYCYCLINE HYCLATE 100 MG: 100 | 10 days supply | Qty: 20 | Fill #0

## 2016-03-15 NOTE — Patient Instructions (Addendum)
For your back pain continue pain meds rx'd by specialist.  I rx hydroxyzine for you insomnia. If this does not help please let me know. Stay off trazadone.  For anxiety continue clonezapam.   Try to stop smoking.  You desire to stop wellbutrin. So can taper off as discussed.  Will get cxr today stat for your cough and then decide which antibiotic to give.   For cough rx benzonatate  Follow up 10-14 days for as needed

## 2016-03-15 NOTE — Progress Notes (Signed)
Subjective:    Patient ID: Derrick Herrera, male    DOB: July 14, 1965, 50 y.o.   MRN: [id number]  HPI   Pt in for follow up. Pt is in for follow up February 23, 2016. Pt  Has had back surgery since then.  Pt pain level is 4/10 now. Pt has a back brace. He is on percocet. Pt saw PA at surgeon office post op.  Pt not having severe pain that runs down his leg. Pt still has some sting and tingling pain left lower leg. Gabapentin did not work and lyrica was too expensive.  Pt is going to see PT in near future   Pt had some sweating intermittently after he takes percocet.   I counseled pt on cautioning regarding his use of trazadone and wellbutrin. He want to stop trazadone and actually got off of trazadone. . So he needs something to help him sleep.   Pt want to taper of wellbutrin. On for twelve years after failed attempt to stop smoking. He states was never depressed just kept using the medication. He thought to himself the other day would like to get off if he can.  Currently smoking pack a day. On Sunday had cough and would bring up mucous that tasted foul. Feels some chest congestion.  Pt using very few clonazepam and I will give refill. He takes about 5 tablets a month at most. Rare use. Often times describes some work stress.           Review of Systems  Constitutional: Negative for chills, fatigue and fever.  HENT: Negative for congestion, ear pain, sinus pressure and sore throat.   Respiratory: Positive for cough. Negative for shortness of breath and wheezing.        See hpi.  Cardiovascular: Negative for chest pain and palpitations.  Gastrointestinal: Negative for abdominal pain.  Musculoskeletal: Negative for back pain.  Skin:       Skin incision site healing.  Neurological: Negative for dizziness, light-headedness and numbness.  Hematological: Negative for adenopathy. Does not bruise/bleed easily.  Psychiatric/Behavioral: Positive for sleep disturbance. Negative for  agitation, behavioral problems, decreased concentration and dysphoric mood. The patient is nervous/anxious.        Objective:   Physical Exam   General  Mental Status - Alert. General Appearance - Well groomed. Not in acute distress.  Skin Rashes- No Rashes.   Neck Neck- Supple. No Masses.   Chest and Lung Exam Auscultation: Breath Sounds:- even and unlabored, but lower lobes rhonchi. RT sound slight more prominent than left.  Cardiovascular Auscultation:Rythm- Regular, rate and rhythm. Murmurs & Other Heart Sounds:Ausculatation of the heart reveal- No Murmurs.  Lymphatic Head & Neck General Head & Neck Lymphatics: Bilateral: Description- No Localized lymphadenopathy.  Abdomen Inspection:-Inspection Normal.  Palpation/Perucssion: Palpation and Percussion of the abdomen reveal- Non Tender, No Rebound tenderness, No rigidity(Guarding) and No Palpable abdominal masses.  Liver:-Normal.  Spleen:- Normal.   Back Faint scar midline. No dc. No indication of infection on inspection..  Lower ext neurologic  L5-S1 sensation intact bilaterally. Lt great toe has better movement compared to prior exam. Normal patellar reflexes bilaterally. No foot drop bilaterally.        Assessment & Plan:  For your back pain continue pain meds rx'd by specialist.  I rx hydroxyzine for you insomnia. If this does not help please let me know. Stay off trazadone.  For anxiety continue clonezapam.   Try to stop smoking.  You desire to stop wellbutrin.  So can taper off as discussed.  Will get cxr today stat for your cough and then decide which antibiotic to give.   Post chest xray review decided to rx doxycycline The xray did not show pneumonia.   For cough rx benzonatate  Follow up 10-14 days for as needed

## 2016-03-16 ENCOUNTER — Telehealth: Payer: Self-pay | Admitting: Behavioral Health

## 2016-03-16 MED FILL — BUPROPION HCL SR 150 MG TAB: 150 | 30 days supply | Qty: 60 | Fill #0

## 2016-03-16 NOTE — Telephone Encounter (Signed)
Informed patient of the below results and provider's recommendations. He verbalized understanding and did not have any further questions or concerns prior to call ending.  Notes Recorded by Mackie Pai, PA-C on 03/15/2016 at 6:05 PM EDT Pt had some bronchitis changes but did not show any pneumonia. So I sent doxycycline to our pharmacy downstairs. Advise make sure he eats something before taking tabs. Not to take on empty stomach. Also his piror rt pleural thickening noted on former chest xray was not mentioned?. But let him know still want to repeat xray in about 3 months. On that xray will plan to specifically ask radiologist to view prior area in question.

## 2016-03-25 ENCOUNTER — Telehealth: Payer: Self-pay | Admitting: Medical

## 2016-03-25 NOTE — Telephone Encounter (Signed)
Relation to IZ:XYOF Call back [phone] Pharmacy: [facility], [place] [phone] (Phone) [phone] (Fax)     Reason for call:  Patient states doxycycline (VIBRA-TABS) 100 MG tablet is not working, patient experiencing wheezing requesting another Rx. Please advise

## 2016-03-25 NOTE — Telephone Encounter (Signed)
Talked with pt asked him to call tomorrow morning to schedule appointment before the weekend since he described wheezing/still sick from last visit.

## 2016-03-25 NOTE — Telephone Encounter (Signed)
Please give pt 9 am, 1 or 1:15 appointment tomorrow 03-26-2016. He should call or will you call him.

## 2016-03-26 ENCOUNTER — Encounter: Payer: Self-pay | Admitting: Medical

## 2016-03-26 ENCOUNTER — Ambulatory Visit (INDEPENDENT_AMBULATORY_CARE_PROVIDER_SITE_OTHER): Payer: BLUE CROSS/BLUE SHIELD | Admitting: Medical

## 2016-03-26 VITALS — BP 122/80 | HR 92 | Temp 98.2°F | Ht 73.0 in | Wt 230.2 lb

## 2016-03-26 DIAGNOSIS — Z720 Tobacco use: Secondary | ICD-10-CM | POA: Diagnosis not present

## 2016-03-26 DIAGNOSIS — J209 Acute bronchitis, unspecified: Secondary | ICD-10-CM

## 2016-03-26 DIAGNOSIS — F17200 Nicotine dependence, unspecified, uncomplicated: Secondary | ICD-10-CM

## 2016-03-26 DIAGNOSIS — R05 Cough: Secondary | ICD-10-CM | POA: Diagnosis not present

## 2016-03-26 DIAGNOSIS — R059 Cough, unspecified: Secondary | ICD-10-CM

## 2016-03-26 MED ORDER — AZITHROMYCIN 250 MG PO TABS: ORAL_TABLET | ORAL | 0 refills | Status: DC

## 2016-03-26 MED ORDER — NICOTINE 14 MG/24HR TD PT24: 14.0000 mg | MEDICATED_PATCH | Freq: Every day | TRANSDERMAL | 0 refills | Status: DC

## 2016-03-26 MED ORDER — ALBUTEROL SULFATE HFA 108 (90 BASE) MCG/ACT IN AERS: 2.0000 | INHALATION_SPRAY | Freq: Four times a day (QID) | RESPIRATORY_TRACT | 0 refills | Status: DC | PRN

## 2016-03-26 MED FILL — AZITHROMYCIN 250 MG TABLET: 250 | 5 days supply | Qty: 6 | Fill #0

## 2016-03-26 MED FILL — NICOTINE 14 MG/24HR PATCH: 14 | 28 days supply | Qty: 28 | Fill #0

## 2016-03-26 MED FILL — PROAIR HFA 90 MCG INHALER: 108 (90 BAS | 25 days supply | Qty: 9 | Fill #0

## 2016-03-26 NOTE — Progress Notes (Signed)
Subjective:    Patient ID: Derrick Herrera, male    DOB: 02-Dec-1965, 50 y.o.   MRN: [id number]  HPI  Pt in for recent cough with some rough breath sounds. For 2 days he was feeling well Monday and Tuesday. But on Wednesday he got recurrent rough breath sounds and repeat productive cough. Then on Thursday night started to get some better. Today the rough breath sounds is improving. So he wanders if he came in too early.  Pt is still smoking. Early this week some wheeze. Not now.  Pt had some insomnia and had expressed desire to get off trazadone. Pt tried hyroxyzine in place of but it did not help. Now he is back on trazadone.   Pt has been tapering off wellbutrin. He has done this since he only started it when he tried to stop smoking. Which was a failed attempt. Years ago. He just never stopped.   Pt has some anxiety history. He has been on clonazepam. He only has used one time. I rx'd 30 tabs.      Review of Systems  Constitutional: Negative for chills, fatigue and fever.  HENT: Negative for congestion, drooling, ear pain, hearing loss and postnasal drip.   Respiratory: Positive for cough. Negative for choking, shortness of breath and wheezing.        Rare wheezing but only earlier in week. Not now.  Cardiovascular: Negative for chest pain and palpitations.  Gastrointestinal: Negative for abdominal pain.  Musculoskeletal: Negative for back pain.  Skin: Negative for rash.  Neurological: Negative for dizziness, seizures, speech difficulty, weakness, numbness and headaches.  Psychiatric/Behavioral: Negative for agitation, behavioral problems, confusion, dysphoric mood, self-injury, sleep disturbance and suicidal ideas. The patient is nervous/anxious.        Very moderate rare anxiety.   Past Medical History:  Diagnosis Date  . Crohn's disease ([place]) 02/19/2009   Indication on colonoscopy dated 02/19/2009     Social History   Social History  . Marital status: Single    Spouse  name: N/A  . Number of children: N/A  . Years of education: N/A   Occupational History  . Not on file.   Social History Main Topics  . Smoking status: Current Every Day Smoker    Types: E-cigarettes  . Smokeless tobacco: Never Used  . Alcohol use Yes     Comment: occ  . Drug use: No  . Sexual activity: No   Other Topics Concern  . Not on file   Social History Narrative  . No narrative on file    Past Surgical History:  Procedure Laterality Date  . BRONCHOSCOPY  2009  . COLONOSCOPY  2010    Family History  Problem Relation Age of Onset  . Colon cancer Maternal Grandmother   . Diabetes Mother     Allergies  Allergen Reactions  . Nsaids     Current Outpatient Prescriptions on File Prior to Visit  Medication Sig Dispense Refill  . benzonatate (TESSALON) 100 MG capsule Take 1 capsule (100 mg total) by mouth 3 (three) times daily as needed for cough. 30 capsule 0  . buPROPion (WELLBUTRIN SR) 150 MG 12 hr tablet Take 1 tablet (150 mg total) by mouth 2 (two) times daily. 60 tablet 0  . clonazePAM (KLONOPIN) 0.5 MG tablet 1 tab po bid prn anxiety 30 tablet 0  . doxycycline (VIBRA-TABS) 100 MG tablet Take 1 tablet (100 mg total) by mouth 2 (two) times daily. 20 tablet 0  . hyoscyamine (  LEVSIN SL) 0.125 MG SL tablet Take by mouth.    . methocarbamol (ROBAXIN) 500 MG tablet     . oxycodone-acetaminophen (LYNOX) 10-300 MG tablet Take 1 tablet by mouth 3 (three) times daily. Taking 3 times daily per patient. Prescribed by Surgeon    . predniSONE (DELTASONE) 20 MG tablet Take by mouth.    . pregabalin (LYRICA) 50 MG capsule Take 50 mg by mouth 3 (three) times daily.    . hydrOXYzine (ATARAX/VISTARIL) 25 MG tablet 1 tab po q hs prn insomnia 30 tablet 0  . ondansetron (ZOFRAN-ODT) 4 MG disintegrating tablet Take by mouth.    . promethazine (PHENERGAN) 25 MG suppository Place rectally.     No current facility-administered medications on file prior to visit.     BP 122/80    Pulse (!) 104   Temp 98.2 F (36.8 C) (Oral)   Ht 6' 1"  (1.854 m)   Wt 230 lb 3.2 oz (104.4 kg)   SpO2 98%   BMI 30.37 kg/m       Objective:   Physical Exam   General  Mental Status - Alert. General Appearance - Well groomed. Not in acute distress.  Skin Rashes- No Rashes.  HEENT Head- Normal. Ear Auditory Canal - Left- Normal. Right - Normal.Tympanic Membrane- Left- Normal. Right- Normal. Eye Sclera/Conjunctiva- Left- Normal. Right- Normal. Nose & Sinuses Nasal Mucosa- Left-  Boggy and Congested. Right-  Boggy and  Congested.Bilateral  no maxillary and  no frontal sinus pressure. Mouth & Throat Lips: Upper Lip- Normal: no dryness, cracking, pallor, cyanosis, or vesicular eruption. Lower Lip-Normal: no dryness, cracking, pallor, cyanosis or vesicular eruption. Buccal Mucosa- Bilateral- No Aphthous ulcers. Oropharynx- No Discharge or Erythema. Tonsils: Characteristics- Bilateral- No Erythema or Congestion. Size/Enlargement- Bilateral- No enlargement. Discharge- bilateral-None.  Neck Neck- Supple. No Masses.   Chest and Lung Exam Auscultation: Breath Sounds:-Clear even and unlabored.  Cardiovascular Auscultation:Rythm- Regular, rate and rhythm. Murmurs & Other Heart Sounds:Ausculatation of the heart reveal- No Murmurs.  Lymphatic Head & Neck General Head & Neck Lymphatics: Bilateral: Description- No Localized lymphadenopathy.       Assessment & Plan:  You may have bronchitis again. Rx azithromycin. Concern for developing chronic bronchits relate to smoking hx.   For  wheezng rx albuterol.  For cough benzonatate.  For insomnia go back to trazadone. Stop hydroxyzine.  For moderate to severe anxiety/more panic attack use clonazepam as directed.  Taper of wellbutrin as you only used this to stop smoking and never was succesful.  In about 2-3 weeks when off wellbutrin and confirmation that your mood is good. Then at that point will rx chantix.  Follow up  2-3 wks cpe or as needed

## 2016-03-26 NOTE — Telephone Encounter (Signed)
Patient scheduled for 03/26/16 at 10:30am 30 minute slot.

## 2016-03-26 NOTE — Patient Instructions (Addendum)
You may have bronchitis again. Rx azithromycin. Concern for developing chronic bronchits relate to smoking hx.   For  wheezng rx albuterol.  For cough benzonatate.  For insomnia go back to trazadone. Stop hydroxyzine.  For moderate to severe anxiety/more panic attack use clonazepam as directed.  Taper of wellbutrin as you only used this to stop smoking and never was succesful.  In about 2-3 weeks when off wellbutrin and confirmation that your mood is good. Then at that point will rx chantix.   During interim nicotine patch rx.  Follow up 2-3 wks cpe or as needed

## 2016-03-26 NOTE — Progress Notes (Signed)
Pre visit review using our clinic tool,if applicable. No additional management support is needed unless otherwise documented below in the visit note.  

## 2016-03-29 ENCOUNTER — Ambulatory Visit: Payer: BLUE CROSS/BLUE SHIELD | Admitting: Medical

## 2016-04-09 DIAGNOSIS — R202 Paresthesia of skin: Secondary | ICD-10-CM | POA: Diagnosis not present

## 2016-04-09 DIAGNOSIS — M545 Low back pain: Secondary | ICD-10-CM | POA: Diagnosis not present

## 2016-04-09 DIAGNOSIS — M6281 Muscle weakness (generalized): Secondary | ICD-10-CM | POA: Diagnosis not present

## 2016-04-09 DIAGNOSIS — M21372 Foot drop, left foot: Secondary | ICD-10-CM | POA: Diagnosis not present

## 2016-04-12 DIAGNOSIS — M545 Low back pain: Secondary | ICD-10-CM | POA: Diagnosis not present

## 2016-04-12 DIAGNOSIS — M21372 Foot drop, left foot: Secondary | ICD-10-CM | POA: Diagnosis not present

## 2016-04-12 DIAGNOSIS — M6281 Muscle weakness (generalized): Secondary | ICD-10-CM | POA: Diagnosis not present

## 2016-04-12 DIAGNOSIS — R202 Paresthesia of skin: Secondary | ICD-10-CM | POA: Diagnosis not present

## 2016-04-15 ENCOUNTER — Ambulatory Visit: Payer: BLUE CROSS/BLUE SHIELD | Admitting: Medical

## 2016-04-22 ENCOUNTER — Encounter: Payer: Self-pay | Admitting: Medical

## 2016-04-22 ENCOUNTER — Ambulatory Visit (INDEPENDENT_AMBULATORY_CARE_PROVIDER_SITE_OTHER): Payer: BLUE CROSS/BLUE SHIELD | Admitting: Medical

## 2016-04-22 VITALS — BP 118/66 | HR 84 | Temp 98.3°F | Ht 73.0 in | Wt 229.0 lb

## 2016-04-22 DIAGNOSIS — R82998 Other abnormal findings in urine: Secondary | ICD-10-CM

## 2016-04-22 DIAGNOSIS — Z0000 Encounter for general adult medical examination without abnormal findings: Secondary | ICD-10-CM | POA: Diagnosis not present

## 2016-04-22 DIAGNOSIS — Z113 Encounter for screening for infections with a predominantly sexual mode of transmission: Secondary | ICD-10-CM | POA: Diagnosis not present

## 2016-04-22 DIAGNOSIS — Z23 Encounter for immunization: Secondary | ICD-10-CM

## 2016-04-22 DIAGNOSIS — R8299 Other abnormal findings in urine: Secondary | ICD-10-CM | POA: Diagnosis not present

## 2016-04-22 LAB — COMPREHENSIVE METABOLIC PANEL
ALT: 23 U/L (ref 0–53)
AST: 18 U/L (ref 0–37)
Albumin: 4.3 g/dL (ref 3.5–5.2)
Alkaline Phosphatase: 129 U/L — ABNORMAL HIGH (ref 39–117)
BILIRUBIN TOTAL: 0.4 mg/dL (ref 0.2–1.2)
BUN: 10 mg/dL (ref 6–23)
CALCIUM: 9.8 mg/dL (ref 8.4–10.5)
CHLORIDE: 103 meq/L (ref 96–112)
CO2: 32 meq/L (ref 19–32)
Creatinine, Ser: 0.93 mg/dL (ref 0.40–1.50)
GFR: 92 mL/min (ref >60.00)
Glucose, Bld: 96 mg/dL (ref 70–99)
Potassium: 4.1 mEq/L (ref 3.5–5.1)
Sodium: 139 mEq/L (ref 135–145)
Total Protein: 7.6 g/dL (ref 6.0–8.3)

## 2016-04-22 LAB — CBC WITH DIFFERENTIAL/PLATELET
BASOS ABS: 0 10*3/uL (ref 0.0–0.1)
BASOS PCT: 0.4 % (ref 0.0–3.0)
Eosinophils Absolute: 0.1 10*3/uL (ref 0.0–0.7)
Eosinophils Relative: 2.2 % (ref 0.0–5.0)
HEMATOCRIT: 40.7 % (ref 39.0–52.0)
Hemoglobin: 13.8 g/dL (ref 13.0–17.0)
LYMPHS PCT: 17.5 % (ref 12.0–46.0)
Lymphs Abs: 1.1 10*3/uL (ref 0.7–4.0)
MCHC: 33.9 g/dL (ref 30.0–36.0)
MCV: 88.7 fl (ref 78.0–100.0)
MONOS PCT: 5.9 % (ref 3.0–12.0)
Monocytes Absolute: 0.4 10*3/uL (ref 0.1–1.0)
NEUTROS ABS: 4.7 10*3/uL (ref 1.4–7.7)
Neutrophils Relative %: 74 % (ref 43.0–77.0)
PLATELETS: 211 10*3/uL (ref 150.0–400.0)
RBC: 4.59 Mil/uL (ref 4.22–5.81)
RDW: 13.1 % (ref 11.5–15.5)
WBC: 6.4 10*3/uL (ref 4.0–10.5)

## 2016-04-22 LAB — POCT URINALYSIS DIPSTICK
BILIRUBIN UA: NEGATIVE
Blood, UA: NEGATIVE
Glucose, UA: NEGATIVE
KETONES UA: NEGATIVE
Nitrite, UA: POSITIVE
Protein, UA: NEGATIVE
Spec Grav, UA: 1.02
Urobilinogen, UA: 0.2
pH, UA: 6

## 2016-04-22 LAB — HIV ANTIBODY (ROUTINE TESTING W REFLEX): HIV 1&2 Ab, 4th Generation: NONREACTIVE

## 2016-04-22 LAB — TSH: TSH: 0.79 u[IU]/mL (ref 0.35–4.50)

## 2016-04-22 MED ORDER — VARENICLINE TARTRATE 0.5 MG X 11 & 1 MG X 42 PO MISC: ORAL | 0 refills | Status: DC

## 2016-04-22 NOTE — Progress Notes (Signed)
Subjective:    Patient ID: Derrick Herrera, male    DOB: Jul 16, 1965, 50 y.o.   MRN: [id number]  HPI  Pt in for follow up.  Pt pulse is now low. He got pulse ox monitor showed pulse has been lower than before. In his past his pulse were high. He states might have been nervous.  Pt is in for smoking cessation. Motivated highly since dad has lung cancer. Pt wants to use chantix. He used it in the past and did not smoke for several years.   Pt states originally used Wellbutrin  to try to stop smoking 10 yrs ago and was never stopped. Just got in habit of taking. Pt tapered of wellbutrin recently. Been off wellbutrin and has not experience no depression. Mood feels fine.  Pt did get over his last episode of bronchitis. Took zpack and used remaining old tabs of doxycycline. No wheezing.   Pt originally scheduled for cpe. Reviewed recent history as indicated above. Pt eats moderate healthy. No exercise due to back pain. Trying to stop smoking.  Pt ok with getting tdap and flu vaccine today.    Review of Systems  Constitutional: Negative for chills, fatigue and fever.  HENT: Negative for congestion, ear pain and sinus pressure.   Respiratory: Negative for cough, chest tightness, shortness of breath and wheezing.   Cardiovascular: Negative for chest pain and palpitations.  Gastrointestinal: Negative for abdominal pain.  Genitourinary: Negative for decreased urine volume, difficulty urinating, discharge, flank pain, genital sores, penile swelling and scrotal swelling.  Musculoskeletal: Positive for back pain. Negative for myalgias and neck stiffness.       Controlled presenlty.  Skin: Negative for rash.  Neurological: Negative for dizziness and headaches.  Hematological: Negative for adenopathy. Does not bruise/bleed easily.  Psychiatric/Behavioral: Negative for behavioral problems and confusion.    Past Medical History:  Diagnosis Date  . Crohn's disease ([place]) 02/19/2009   Indication  on colonoscopy dated 02/19/2009     Social History   Social History  . Marital status: Single    Spouse name: N/A  . Number of children: N/A  . Years of education: N/A   Occupational History  . Not on file.   Social History Main Topics  . Smoking status: Current Every Day Smoker    Types: E-cigarettes  . Smokeless tobacco: Never Used  . Alcohol use Yes     Comment: occ  . Drug use: No  . Sexual activity: No   Other Topics Concern  . Not on file   Social History Narrative  . No narrative on file    Past Surgical History:  Procedure Laterality Date  . BRONCHOSCOPY  2009  . COLONOSCOPY  2010    Family History  Problem Relation Age of Onset  . Colon cancer Maternal Grandmother   . Diabetes Mother     Allergies  Allergen Reactions  . Nsaids     Current Outpatient Prescriptions on File Prior to Visit  Medication Sig Dispense Refill  . buPROPion (WELLBUTRIN SR) 150 MG 12 hr tablet Take 1 tablet (150 mg total) by mouth 2 (two) times daily. 60 tablet 0  . clonazePAM (KLONOPIN) 0.5 MG tablet 1 tab po bid prn anxiety 30 tablet 0  . hyoscyamine (LEVSIN SL) 0.125 MG SL tablet Take by mouth.    . methocarbamol (ROBAXIN) 500 MG tablet     . nicotine (NICODERM CQ - DOSED IN MG/24 HOURS) 14 mg/24hr patch Place 1 patch (14 mg total) onto  the skin daily. 28 patch 0  . ondansetron (ZOFRAN-ODT) 4 MG disintegrating tablet Take by mouth.    Marland Kitchen oxycodone-acetaminophen (LYNOX) 10-300 MG tablet Take 1 tablet by mouth 3 (three) times daily. Taking 3 times daily per patient. Prescribed by Surgeon    . pregabalin (LYRICA) 50 MG capsule Take 50 mg by mouth 3 (three) times daily.     No current facility-administered medications on file prior to visit.     BP 118/66 (BP Location: Right Arm, Patient Position: Sitting, Cuff Size: Normal)   Pulse 84   Temp 98.3 F (36.8 C) (Oral)   Ht 6' 1"  (1.854 m)   Wt 229 lb (103.9 kg)   SpO2 98%   BMI 30.21 kg/m       Objective:   Physical  Exam  General Mental Status- Alert. General Appearance- Not in acute distress.   Skin General: Color- Normal Color. Moisture- Normal Moisture.  Neck Carotid Arteries- Normal color. Moisture- Normal Moisture. No carotid bruits. No JVD.  Chest and Lung Exam Auscultation: Breath Sounds:-Normal.  Cardiovascular Auscultation:Rythm- Regular. Murmurs & Other Heart Sounds:Auscultation of the heart reveals- No Murmurs.  Abdomen Inspection:-Inspeection Normal. Palpation/Percussion:Note:No mass. Palpation and Percussion of the abdomen reveal- Non Tender, Non Distended + BS, no rebound or guarding.   Neurologic Cranial Nerve exam:- CN III-XII intact(No nystagmus), symmetric smile. Drift Test:- No drift. Romberg Exam:- Negative.  Heal to Toe Gait exam:-Normal. Finger to Nose:- Normal/Intact Strength:- 5/5 equal and symmetric strength both upper and lower extremities.  Rectal- pt deferred. Genital- pt deferred.      Assessment & Plan:  For your wellness exam will get cbc, cmp, tsh, lipid panel, hiv and UA.  For smoking cessation rx of chantix.(if side effects excessive or mood decreased please stop and notify us).  As your back improved increase exercise program.  Follow up date to be determined after lab review.   [place] D[place]co, Percell Miller, PA-C

## 2016-04-22 NOTE — Patient Instructions (Addendum)
For your wellness exam will get cbc, cmp, tsh, lipid panel, hiv and UA.  For smoking cessation rx of chantix.(if side effects excessive or mood decreased please stop and notify us).  As your back improved increase exercise program.  Follow up date to be determined after lab review.    Preventive Care for Adults, Male A healthy lifestyle and preventive care can promote health and wellness. Preventive health guidelines for men include the following key practices:  A routine yearly physical is a good way to check with your health care provider about your health and preventative screening. It is a chance to share any concerns and updates on your health and to receive a thorough exam.  Visit your dentist for a routine exam and preventative care every 6 months. Brush your teeth twice a day and floss once a day. Good oral hygiene prevents tooth decay and gum disease.  The frequency of eye exams is based on your age, health, family medical history, use of contact lenses, and other factors. Follow your health care provider's recommendations for frequency of eye exams.  Eat a healthy diet. Foods such as vegetables, fruits, whole grains, low-fat dairy products, and lean protein foods contain the nutrients you need without too many calories. Decrease your intake of foods high in solid fats, added sugars, and salt. Eat the right amount of calories for you.Get information about a proper diet from your health care provider, if necessary.  Regular physical exercise is one of the most important things you can do for your health. Most adults should get at least 150 minutes of moderate-intensity exercise (any activity that increases your heart rate and causes you to sweat) each week. In addition, most adults need muscle-strengthening exercises on 2 or more days a week.  Maintain a healthy weight. The body mass index (BMI) is a screening tool to identify possible weight problems. It provides an estimate of body  fat based on height and weight. Your health care provider can find your BMI and can help you achieve or maintain a healthy weight.For adults 20 years and older:  A BMI below 18.5 is considered underweight.  A BMI of 18.5 to 24.9 is normal.  A BMI of 25 to 29.9 is considered overweight.  A BMI of 30 and above is considered obese.  Maintain normal blood lipids and cholesterol levels by exercising and minimizing your intake of saturated fat. Eat a balanced diet with plenty of fruit and vegetables. Blood tests for lipids and cholesterol should begin at age 58 and be repeated every 5 years. If your lipid or cholesterol levels are high, you are over 50, or you are at high risk for heart disease, you may need your cholesterol levels checked more frequently.Ongoing high lipid and cholesterol levels should be treated with medicines if diet and exercise are not working.  If you smoke, find out from your health care provider how to quit. If you do not use tobacco, do not start.  Lung cancer screening is recommended for adults aged 39-80 years who are at high risk for developing lung cancer because of a history of smoking. A yearly low-dose CT scan of the lungs is recommended for people who have at least a 30-pack-year history of smoking and are a current smoker or have quit within the past 15 years. A pack year of smoking is smoking an average of 1 pack of cigarettes a day for 1 year (for example: 1 pack a day for 30 years or 2  packs a day for 15 years). Yearly screening should continue until the smoker has stopped smoking for at least 15 years. Yearly screening should be stopped for people who develop a health problem that would prevent them from having lung cancer treatment.  If you choose to drink alcohol, do not have more than 2 drinks per day. One drink is considered to be 12 ounces (355 mL) of beer, 5 ounces (148 mL) of wine, or 1.5 ounces (44 mL) of liquor.  Avoid use of street drugs. Do not share  needles with anyone. Ask for help if you need support or instructions about stopping the use of drugs.  High blood pressure causes heart disease and increases the risk of stroke. Your blood pressure should be checked at least every 1-2 years. Ongoing high blood pressure should be treated with medicines, if weight loss and exercise are not effective.  If you are 54-42 years old, ask your health care provider if you should take aspirin to prevent heart disease.  Diabetes screening is done by taking a blood sample to check your blood glucose level after you have not eaten for a certain period of time (fasting). If you are not overweight and you do not have risk factors for diabetes, you should be screened once every 3 years starting at age 14. If you are overweight or obese and you are 62-56 years of age, you should be screened for diabetes every year as part of your cardiovascular risk assessment.  Colorectal cancer can be detected and often prevented. Most routine colorectal cancer screening begins at the age of 69 and continues through age 24. However, your health care provider may recommend screening at an earlier age if you have risk factors for colon cancer. On a yearly basis, your health care provider may provide home test kits to check for hidden blood in the stool. Use of a small camera at the end of a tube to directly examine the colon (sigmoidoscopy or colonoscopy) can detect the earliest forms of colorectal cancer. Talk to your health care provider about this at age 11, when routine screening begins. Direct exam of the colon should be repeated every 5-10 years through age 65, unless early forms of precancerous polyps or small growths are found.  People who are at an increased risk for hepatitis B should be screened for this virus. You are considered at high risk for hepatitis B if:  You were born in a country where hepatitis B occurs often. Talk with your health care provider about which  countries are considered high risk.  Your parents were born in a high-risk country and you have not received a shot to protect against hepatitis B (hepatitis B vaccine).  You have HIV or AIDS.  You use needles to inject street drugs.  You live with, or have sex with, someone who has hepatitis B.  You are a man who has sex with other men (MSM).  You get hemodialysis treatment.  You take certain medicines for conditions such as cancer, organ transplantation, and autoimmune conditions.  Hepatitis C blood testing is recommended for all people born from [birth year] through 1965 and any individual with known risks for hepatitis C.  Practice safe sex. Use condoms and avoid high-risk sexual practices to reduce the spread of sexually transmitted infections (STIs). STIs include gonorrhea, chlamydia, syphilis, trichomonas, herpes, HPV, and human immunodeficiency virus (HIV). Herpes, HIV, and HPV are viral illnesses that have no cure. They can result in disability, cancer, and death.  If you are a man who has sex with other men, you should be screened at least once per year for:  HIV.  Urethral, rectal, and pharyngeal infection of gonorrhea, chlamydia, or both.  If you are at risk of being infected with HIV, it is recommended that you take a prescription medicine daily to prevent HIV infection. This is called preexposure prophylaxis (PrEP). You are considered at risk if:  You are a man who has sex with other men (MSM) and have other risk factors.  You are a heterosexual man, are sexually active, and are at increased risk for HIV infection.  You take drugs by injection.  You are sexually active with a partner who has HIV.  Talk with your health care provider about whether you are at high risk of being infected with HIV. If you choose to begin PrEP, you should first be tested for HIV. You should then be tested every 3 months for as long as you are taking PrEP.  A one-time screening for abdominal  aortic aneurysm (AAA) and surgical repair of large AAAs by ultrasound are recommended for men ages 80 to 79 years who are current or former smokers.  Healthy men should no longer receive prostate-specific antigen (PSA) blood tests as part of routine cancer screening. Talk with your health care provider about prostate cancer screening.  Testicular cancer screening is not recommended for adult males who have no symptoms. Screening includes self-exam, a health care provider exam, and other screening tests. Consult with your health care provider about any symptoms you have or any concerns you have about testicular cancer.  Use sunscreen. Apply sunscreen liberally and repeatedly throughout the day. You should seek shade when your shadow is shorter than you. Protect yourself by wearing long sleeves, pants, a wide-brimmed hat, and sunglasses year round, whenever you are outdoors.  Once a month, do a whole-body skin exam, using a mirror to look at the skin on your back. Tell your health care provider about new moles, moles that have irregular borders, moles that are larger than a pencil eraser, or moles that have changed in shape or color.  Stay current with required vaccines (immunizations).  Influenza vaccine. All adults should be immunized every year.  Tetanus, diphtheria, and acellular pertussis (Td, Tdap) vaccine. An adult who has not previously received Tdap or who does not know his vaccine status should receive 1 dose of Tdap. This initial dose should be followed by tetanus and diphtheria toxoids (Td) booster doses every 10 years. Adults with an unknown or incomplete history of completing a 3-dose immunization series with Td-containing vaccines should begin or complete a primary immunization series including a Tdap dose. Adults should receive a Td booster every 10 years.  Varicella vaccine. An adult without evidence of immunity to varicella should receive 2 doses or a second dose if he has previously  received 1 dose.  Human papillomavirus (HPV) vaccine. Males aged 11-21 years who have not received the vaccine previously should receive the 3-dose series. Males aged 22-26 years may be immunized. Immunization is recommended through the age of 50 years for any male who has sex with males and did not get any or all doses earlier. Immunization is recommended for any person with an immunocompromised condition through the age of 64 years if he did not get any or all doses earlier. During the 3-dose series, the second dose should be obtained 4-8 weeks after the first dose. The third dose should be obtained 24 weeks after  the first dose and 16 weeks after the second dose.  Zoster vaccine. One dose is recommended for adults aged 46 years or older unless certain conditions are present.  Measles, mumps, and rubella (MMR) vaccine. Adults born before 40 generally are considered immune to measles and mumps. Adults born in [birth year] or later should have 1 or more doses of MMR vaccine unless there is a contraindication to the vaccine or there is laboratory evidence of immunity to each of the three diseases. A routine second dose of MMR vaccine should be obtained at least 28 days after the first dose for students attending postsecondary schools, health care workers, or international travelers. People who received inactivated measles vaccine or an unknown type of measles vaccine during 1963-1967 should receive 2 doses of MMR vaccine. People who received inactivated mumps vaccine or an unknown type of mumps vaccine before 1979 and are at high risk for mumps infection should consider immunization with 2 doses of MMR vaccine. Unvaccinated health care workers born before [birth year] who lack laboratory evidence of measles, mumps, or rubella immunity or laboratory confirmation of disease should consider measles and mumps immunization with 2 doses of MMR vaccine or rubella immunization with 1 dose of MMR vaccine.  Pneumococcal 13-valent  conjugate (PCV13) vaccine. When indicated, a person who is uncertain of his immunization history and has no record of immunization should receive the PCV13 vaccine. All adults 15 years of age and older should receive this vaccine. An adult aged 17 years or older who has certain medical conditions and has not been previously immunized should receive 1 dose of PCV13 vaccine. This PCV13 should be followed with a dose of pneumococcal polysaccharide (PPSV23) vaccine. Adults who are at high risk for pneumococcal disease should obtain the PPSV23 vaccine at least 8 weeks after the dose of PCV13 vaccine. Adults older than 48 years of age who have normal immune system function should obtain the PPSV23 vaccine dose at least 1 year after the dose of PCV13 vaccine.  Pneumococcal polysaccharide (PPSV23) vaccine. When PCV13 is also indicated, PCV13 should be obtained first. All adults aged 4 years and older should be immunized. An adult younger than age 43 years who has certain medical conditions should be immunized. Any person who resides in a nursing home or long-term care facility should be immunized. An adult smoker should be immunized. People with an immunocompromised condition and certain other conditions should receive both PCV13 and PPSV23 vaccines. People with human immunodeficiency virus (HIV) infection should be immunized as soon as possible after diagnosis. Immunization during chemotherapy or radiation therapy should be avoided. Routine use of PPSV23 vaccine is not recommended for American Indians, [place] Natives, or people younger than 65 years unless there are medical conditions that require PPSV23 vaccine. When indicated, people who have unknown immunization and have no record of immunization should receive PPSV23 vaccine. One-time revaccination 5 years after the first dose of PPSV23 is recommended for people aged 19-64 years who have chronic kidney failure, nephrotic syndrome, asplenia, or immunocompromised  conditions. People who received 1-2 doses of PPSV23 before age 61 years should receive another dose of PPSV23 vaccine at age 46 years or later if at least 5 years have passed since the previous dose. Doses of PPSV23 are not needed for people immunized with PPSV23 at or after age 7 years.  Meningococcal vaccine. Adults with asplenia or persistent complement component deficiencies should receive 2 doses of quadrivalent meningococcal conjugate (MenACWY-D) vaccine. The doses should be obtained at least 2 months  apart. Microbiologists working with certain meningococcal bacteria, [employer] recruits, people at risk during an outbreak, and people who travel to or live in countries with a high rate of meningitis should be immunized. A first-year college student up through age 84 years who is living in a residence hall should receive a dose if he did not receive a dose on or after his 16th birthday. Adults who have certain high-risk conditions should receive one or more doses of vaccine.  Hepatitis A vaccine. Adults who wish to be protected from this disease, have chronic liver disease, work with hepatitis A-infected animals, work in hepatitis A research labs, or travel to or work in countries with a high rate of hepatitis A should be immunized. Adults who were previously unvaccinated and who anticipate close contact with an international adoptee during the first 60 days after arrival in the [place] from a country with a high rate of hepatitis A should be immunized.  Hepatitis B vaccine. Adults should be immunized if they wish to be protected from this disease, are under age 69 years and have diabetes, have chronic liver disease, have had more than one sex partner in the past 6 months, may be exposed to blood or other infectious body fluids, are household contacts or sex partners of hepatitis B positive people, are clients or workers in certain care facilities, or travel to or work in countries with a high rate of  hepatitis B.  Haemophilus influenzae type b (Hib) vaccine. A previously unvaccinated person with asplenia or sickle cell disease or having a scheduled splenectomy should receive 1 dose of Hib vaccine. Regardless of previous immunization, a recipient of a hematopoietic stem cell transplant should receive a 3-dose series 6-12 months after his successful transplant. Hib vaccine is not recommended for adults with HIV infection. Preventive Service / Frequency Ages 68 to 45  Blood pressure check.** / Every 3-5 years.  Lipid and cholesterol check.** / Every 5 years beginning at age 56.  Hepatitis C blood test.** / For any individual with known risks for hepatitis C.  Skin self-exam. / Monthly.  Influenza vaccine. / Every year.  Tetanus, diphtheria, and acellular pertussis (Tdap, Td) vaccine.** / Consult your health care provider. 1 dose of Td every 10 years.  Varicella vaccine.** / Consult your health care provider.  HPV vaccine. / 3 doses over 6 months, if 61 or younger.  Measles, mumps, rubella (MMR) vaccine.** / You need at least 1 dose of MMR if you were born in 1957 or later. You may also need a second dose.  Pneumococcal 13-valent conjugate (PCV13) vaccine.** / Consult your health care provider.  Pneumococcal polysaccharide (PPSV23) vaccine.** / 1 to 2 doses if you smoke cigarettes or if you have certain conditions.  Meningococcal vaccine.** / 1 dose if you are age 48 to 45 years and a Market researcher living in a residence hall, or have one of several medical conditions. You may also need additional booster doses.  Hepatitis A vaccine.** / Consult your health care provider.  Hepatitis B vaccine.** / Consult your health care provider.  Haemophilus influenzae type b (Hib) vaccine.** / Consult your health care provider. Ages 39 to 14  Blood pressure check.** / Every year.  Lipid and cholesterol check.** / Every 5 years beginning at age 24.  Lung cancer screening. /  Every year if you are aged 30-80 years and have a 30-pack-year history of smoking and currently smoke or have quit within the past 15 years. Yearly screening  is stopped once you have quit smoking for at least 15 years or develop a health problem that would prevent you from having lung cancer treatment.  Fecal occult blood test (FOBT) of stool. / Every year beginning at age 76 and continuing until age 39. You may not have to do this test if you get a colonoscopy every 10 years.  Flexible sigmoidoscopy** or colonoscopy.** / Every 5 years for a flexible sigmoidoscopy or every 10 years for a colonoscopy beginning at age 57 and continuing until age 4.  Hepatitis C blood test.** / For all people born from [birth year] through 1965 and any individual with known risks for hepatitis C.  Skin self-exam. / Monthly.  Influenza vaccine. / Every year.  Tetanus, diphtheria, and acellular pertussis (Tdap/Td) vaccine.** / Consult your health care provider. 1 dose of Td every 10 years.  Varicella vaccine.** / Consult your health care provider.  Zoster vaccine.** / 1 dose for adults aged 77 years or older.  Measles, mumps, rubella (MMR) vaccine.** / You need at least 1 dose of MMR if you were born in 1957 or later. You may also need a second dose.  Pneumococcal 13-valent conjugate (PCV13) vaccine.** / Consult your health care provider.  Pneumococcal polysaccharide (PPSV23) vaccine.** / 1 to 2 doses if you smoke cigarettes or if you have certain conditions.  Meningococcal vaccine.** / Consult your health care provider.  Hepatitis A vaccine.** / Consult your health care provider.  Hepatitis B vaccine.** / Consult your health care provider.  Haemophilus influenzae type b (Hib) vaccine.** / Consult your health care provider. Ages 2 and over  Blood pressure check.** / Every year.  Lipid and cholesterol check.**/ Every 5 years beginning at age 23.  Lung cancer screening. / Every year if you are aged 47-80  years and have a 30-pack-year history of smoking and currently smoke or have quit within the past 15 years. Yearly screening is stopped once you have quit smoking for at least 15 years or develop a health problem that would prevent you from having lung cancer treatment.  Fecal occult blood test (FOBT) of stool. / Every year beginning at age 28 and continuing until age 49. You may not have to do this test if you get a colonoscopy every 10 years.  Flexible sigmoidoscopy** or colonoscopy.** / Every 5 years for a flexible sigmoidoscopy or every 10 years for a colonoscopy beginning at age 9 and continuing until age 81.  Hepatitis C blood test.** / For all people born from [birth year] through 1963 and any individual with known risks for hepatitis C.  Abdominal aortic aneurysm (AAA) screening.** / A one-time screening for ages 25 to 40 years who are current or former smokers.  Skin self-exam. / Monthly.  Influenza vaccine. / Every year.  Tetanus, diphtheria, and acellular pertussis (Tdap/Td) vaccine.** / 1 dose of Td every 10 years.  Varicella vaccine.** / Consult your health care provider.  Zoster vaccine.** / 1 dose for adults aged 54 years or older.  Pneumococcal 13-valent conjugate (PCV13) vaccine.** / 1 dose for all adults aged 3 years and older.  Pneumococcal polysaccharide (PPSV23) vaccine.** / 1 dose for all adults aged 81 years and older.  Meningococcal vaccine.** / Consult your health care provider.  Hepatitis A vaccine.** / Consult your health care provider.  Hepatitis B vaccine.** / Consult your health care provider.  Haemophilus influenzae type b (Hib) vaccine.** / Consult your health care provider. **Family history and personal history of risk and conditions may change  your health care provider's recommendations.   This information is not intended to replace advice given to you by your health care provider. Make sure you discuss any questions you have with your health care  provider.   Document Released: 08/17/2001 Document Revised: 07/12/2014 Document Reviewed: 11/16/2010 Elsevier Interactive Patient Education [employer].

## 2016-04-22 NOTE — Progress Notes (Signed)
Pre visit review using our clinic review tool, if applicable. No additional management support is needed unless otherwise documented below in the visit note./hsm

## 2016-04-23 ENCOUNTER — Telehealth: Payer: Self-pay | Admitting: Medical

## 2016-04-23 LAB — LIPID PANEL
CHOLESTEROL: 151 mg/dL (ref 0–200)
HDL: 34.8 mg/dL — ABNORMAL LOW (ref >39.00)
LDL Cholesterol: 89 mg/dL (ref 0–99)
NonHDL: 115.96
TRIGLYCERIDES: 135 mg/dL (ref 0.0–149.0)
Total CHOL/HDL Ratio: 4
VLDL: 27 mg/dL (ref 0.0–40.0)

## 2016-04-23 NOTE — Telephone Encounter (Signed)
Pt lipid panel got put in as future. Will you go ahead and run that today. He was fasting yesterday. Accidentally chose future lipid apparently.?

## 2016-04-23 NOTE — Telephone Encounter (Signed)
Lipid was released to [employer] and ran with other orders.

## 2016-04-23 NOTE — Addendum Note (Signed)
Addended by: Caffie Pinto on: 04/23/2016 08:19 AM   Modules accepted: Orders

## 2016-04-24 LAB — URINE CULTURE

## 2016-04-25 ENCOUNTER — Telehealth: Payer: Self-pay | Admitting: Medical

## 2016-04-25 MED ORDER — CIPROFLOXACIN HCL 500 MG PO TABS: 500.0000 mg | ORAL_TABLET | Freq: Two times a day (BID) | ORAL | 0 refills | Status: DC

## 2016-04-25 NOTE — Telephone Encounter (Signed)
cipro antibiotic sent to his pharmacy.

## 2016-04-26 ENCOUNTER — Other Ambulatory Visit: Payer: Self-pay

## 2016-04-26 MED ORDER — CIPROFLOXACIN HCL 500 MG PO TABS: 500.0000 mg | ORAL_TABLET | Freq: Two times a day (BID) | ORAL | 0 refills | Status: DC

## 2016-04-26 NOTE — Telephone Encounter (Signed)
Patient requested that his Rx for Cipro 500 mg be sent to [facility] on [address]. Pt was made aware that his Rx would be sent to that pharmacy.

## 2016-04-29 ENCOUNTER — Ambulatory Visit: Payer: BLUE CROSS/BLUE SHIELD | Admitting: *Deleted

## 2016-04-29 VITALS — Ht 73.0 in | Wt 230.4 lb

## 2016-04-29 DIAGNOSIS — K5010 Crohn's disease of large intestine without complications: Secondary | ICD-10-CM

## 2016-04-29 MED ORDER — SUPREP BOWEL PREP KIT 17.5-3.13-1.6 GM/177ML PO SOLN: 1.0000 | Freq: Once | ORAL | 0 refills | Status: AC

## 2016-04-29 NOTE — Progress Notes (Signed)
Patient denies any allergies to egg or soy products. Patient denies complications with anesthesia/sedation.  Patient denies oxygen use at home and denies diet medications. Emmi instructions for colonoscop  explained but patient denied.

## 2016-04-30 ENCOUNTER — Encounter: Payer: Self-pay | Admitting: Gastroenterology

## 2016-05-06 DIAGNOSIS — M6281 Muscle weakness (generalized): Secondary | ICD-10-CM | POA: Diagnosis not present

## 2016-05-06 DIAGNOSIS — M545 Low back pain: Secondary | ICD-10-CM | POA: Diagnosis not present

## 2016-05-06 DIAGNOSIS — M21372 Foot drop, left foot: Secondary | ICD-10-CM | POA: Diagnosis not present

## 2016-05-06 DIAGNOSIS — R202 Paresthesia of skin: Secondary | ICD-10-CM | POA: Diagnosis not present

## 2016-05-10 ENCOUNTER — Telehealth: Payer: Self-pay | Admitting: *Deleted

## 2016-05-10 ENCOUNTER — Ambulatory Visit (AMBULATORY_SURGERY_CENTER): Payer: BLUE CROSS/BLUE SHIELD | Admitting: Gastroenterology

## 2016-05-10 ENCOUNTER — Encounter: Payer: Self-pay | Admitting: Gastroenterology

## 2016-05-10 VITALS — BP 116/71 | HR 82 | Temp 95.5°F | Resp 12 | Ht 73.0 in | Wt 230.0 lb

## 2016-05-10 DIAGNOSIS — K5010 Crohn's disease of large intestine without complications: Secondary | ICD-10-CM | POA: Diagnosis not present

## 2016-05-10 DIAGNOSIS — K5090 Crohn's disease, unspecified, without complications: Secondary | ICD-10-CM | POA: Diagnosis not present

## 2016-05-10 DIAGNOSIS — D120 Benign neoplasm of cecum: Secondary | ICD-10-CM

## 2016-05-10 DIAGNOSIS — R197 Diarrhea, unspecified: Secondary | ICD-10-CM

## 2016-05-10 DIAGNOSIS — K635 Polyp of colon: Secondary | ICD-10-CM | POA: Diagnosis not present

## 2016-05-10 MED ORDER — SODIUM CHLORIDE 0.9 % IV SOLN: 500.0000 mL | INTRAVENOUS | Status: DC

## 2016-05-10 NOTE — Telephone Encounter (Signed)
Follow up call placed to patient regarding prep. Patient had called on call MD last evening. Patient started his prep at 1730, no BM at 2200. Patient stating his bowel movements started at 2300. BM this am was clear. Patient has already started his 2nd prep this am and is on clear liquids. Patient plans to arrive at 1430. Patient instructed to call with any difficulties.

## 2016-05-10 NOTE — Progress Notes (Signed)
To recovery, report to [employer], Therapist, sports, V[facility]

## 2016-05-10 NOTE — Patient Instructions (Signed)
Impression/Recommendations:  Polyp handout given to patient. Diverticulosis handout given to patient. Hemorrhoid handout given to patient.  No Aspirin, ibuprofen, naproxen, or other NSAIDS for 2 weeks.   Tylenol only.  Repeat colonoscopy recommended for surveillance based on pathology results.  YOU HAD AN ENDOSCOPIC PROCEDURE TODAY AT [facility]:   Refer to the procedure report that was given to you for any specific questions about what was found during the examination.  If the procedure report does not answer your questions, please call your gastroenterologist to clarify.  If you requested that your care partner not be given the details of your procedure findings, then the procedure report has been included in a sealed envelope for you to review at your convenience later.  YOU SHOULD EXPECT: Some feelings of bloating in the abdomen. Passage of more gas than usual.  Walking can help get rid of the air that was put into your GI tract during the procedure and reduce the bloating. If you had a lower endoscopy (such as a colonoscopy or flexible sigmoidoscopy) you may notice spotting of blood in your stool or on the toilet paper. If you underwent a bowel prep for your procedure, you may not have a normal bowel movement for a few days.  Please Note:  You might notice some irritation and congestion in your nose or some drainage.  This is from the oxygen used during your procedure.  There is no need for concern and it should clear up in a day or so.  SYMPTOMS TO REPORT IMMEDIATELY:   Following lower endoscopy (colonoscopy or flexible sigmoidoscopy):  Excessive amounts of blood in the stool  Significant tenderness or worsening of abdominal pains  Swelling of the abdomen that is new, acute  Fever of 100F or higher For urgent or emergent issues, a gastroenterologist can be reached at any hour by calling [phone].   DIET:  We do recommend a small meal at first, but then you  may proceed to your regular diet.  Drink plenty of fluids but you should avoid alcoholic beverages for 24 hours.  ACTIVITY:  You should plan to take it easy for the rest of today and you should NOT DRIVE or use heavy machinery until tomorrow (because of the sedation medicines used during the test).    FOLLOW UP: Our staff will call the number listed on your records the next business day following your procedure to check on you and address any questions or concerns that you may have regarding the information given to you following your procedure. If we do not reach you, we will leave a message.  However, if you are feeling well and you are not experiencing any problems, there is no need to return our call.  We will assume that you have returned to your regular daily activities without incident.  If any biopsies were taken you will be contacted by phone or by letter within the next 1-3 weeks.  Please call us at [phone] if you have not heard about the biopsies in 3 weeks.    SIGNATURES/CONFIDENTIALITY: You and/or your care partner have signed paperwork which will be entered into your electronic medical record.  These signatures attest to the fact that that the information above on your After Visit Summary has been reviewed and is understood.  Full responsibility of the confidentiality of this discharge information lies with you and/or your care-partner.

## 2016-05-10 NOTE — Op Note (Signed)
[place] Patient Name: Derrick Herrera Procedure Date: 05/10/2016 4:07 PM MRN: [id number] Endoscopist: Remo Lipps P. [name] MD, MD Age: 50 Referring MD:  Date of Birth: [birth year] Gender: Male Account #: [id number] Procedure:                Colonoscopy Indications:              Determine extent and severity of inflammatory bowel                            disease (reported ileal Crohn's), ongoing diarrhea                            / abdominal pain Medicines:                Monitored Anesthesia Care Procedure:                Pre-Anesthesia Assessment:                           - Prior to the procedure, a History and Physical                            was performed, and patient medications and                            allergies were reviewed. The patient's tolerance of                            previous anesthesia was also reviewed. The risks                            and benefits of the procedure and the sedation                            options and risks were discussed with the patient.                            All questions were answered, and informed consent                            was obtained. Prior Anticoagulants: The patient has                            taken no previous anticoagulant or antiplatelet                            agents. ASA Grade Assessment: II - A patient with                            mild systemic disease. After reviewing the risks                            and benefits, the patient was deemed in  satisfactory condition to undergo the procedure.                           After obtaining informed consent, the colonoscope                            was passed under direct vision. Throughout the                            procedure, the patient's blood pressure, pulse, and                            oxygen saturations were monitored continuously. The                            Model CF-HQ190L [id number]) scope  was introduced                            through the anus and advanced to the the terminal                            ileum, with identification of the appendiceal                            orifice and IC valve. The colonoscopy was performed                            without difficulty. The patient tolerated the                            procedure well. The quality of the bowel                            preparation was adequate. The terminal ileum,                            ileocecal valve, appendiceal orifice, and rectum                            were photographed. Scope In: 4:18:03 PM Scope Out: 4:36:05 PM Scope Withdrawal Time: 0 hours 15 minutes 21 seconds  Total Procedure Duration: 0 hours 18 minutes 2 seconds  Findings:                 The perianal and digital rectal examinations were                            normal.                           The terminal ileum appeared normal.                           A 5 mm polyp was found in the cecum. The polyp was  sessile. The polyp was removed with a cold snare.                            Resection and retrieval were complete.                           Non-bleeding internal hemorrhoids were found during                            retroflexion.                           A few small-mouthed diverticula were found in the                            sigmoid colon.                           The exam was otherwise normal throughout the                            examined colon. No inflammatory changes.                           Biopsies for histology were taken with a cold                            forceps from the right colon and left colon for                            evaluation of microscopic colitis. Complications:            No immediate complications. Estimated blood loss:                            Minimal. Estimated Blood Loss:     Estimated blood loss was minimal. Impression:               - The  examined portion of the ileum was normal.                           - One 5 mm polyp in the cecum, removed with a cold                            snare. Resected and retrieved.                           - Non-bleeding internal hemorrhoids.                           - Diverticulosis in the sigmoid colon.                           - Biopsies were taken with a cold forceps from the  right colon and left colon for evaluation of                            microscopic colitis.                           Overall, no evidence of active Crohn's on this                            exam. Will await pathology results. Recommendation:           - Patient has a contact number available for                            emergencies. The signs and symptoms of potential                            delayed complications were discussed with the                            patient. Return to normal activities tomorrow.                            Written discharge instructions were provided to the                            patient.                           - Resume previous diet.                           - Continue present medications.                           - No aspirin, ibuprofen, naproxen, or other                            non-steroidal anti-inflammatory drugs for 2 weeks                            after polyp removal.                           - Await pathology results.                           - Repeat colonoscopy is recommended for                            surveillance. The colonoscopy date will be                            determined after pathology results from today's                            exam become available for review.                           -  Consideration for enterography study to further                            workup Crohn's pending results. Remo Lipps P. [name] MD, MD 05/10/2016 4:46:17 PM This report has been signed electronically.

## 2016-05-10 NOTE — Progress Notes (Signed)
Called to room to assist during endoscopic procedure.  Patient ID and intended procedure confirmed with present staff. Received instructions for my participation in the procedure from the performing physician.  

## 2016-05-11 ENCOUNTER — Telehealth: Payer: Self-pay | Admitting: *Deleted

## 2016-05-11 NOTE — Telephone Encounter (Signed)
  Follow up Call-  Call back number 05/10/2016  Post procedure Call Back phone  # [phone]  Permission to leave phone message Yes  Some recent data might be hidden     Patient questions:  Do you have a fever, pain , or abdominal swelling? No. Pain Score  0 *  Have you tolerated food without any problems? Yes.    Have you been able to return to your normal activities? yes  Do you have any questions about your discharge instructions: Diet   No. Medications  No. Follow up visit  No.  Do you have questions or concerns about your Care? No.  Actions: * If pain score is 4 or above: No action needed, pain <4.

## 2016-05-12 DIAGNOSIS — R202 Paresthesia of skin: Secondary | ICD-10-CM | POA: Diagnosis not present

## 2016-05-12 DIAGNOSIS — M21372 Foot drop, left foot: Secondary | ICD-10-CM | POA: Diagnosis not present

## 2016-05-12 DIAGNOSIS — M6281 Muscle weakness (generalized): Secondary | ICD-10-CM | POA: Diagnosis not present

## 2016-05-12 DIAGNOSIS — M545 Low back pain: Secondary | ICD-10-CM | POA: Diagnosis not present

## 2016-05-14 DIAGNOSIS — M6281 Muscle weakness (generalized): Secondary | ICD-10-CM | POA: Diagnosis not present

## 2016-05-14 DIAGNOSIS — R202 Paresthesia of skin: Secondary | ICD-10-CM | POA: Diagnosis not present

## 2016-05-14 DIAGNOSIS — M545 Low back pain: Secondary | ICD-10-CM | POA: Diagnosis not present

## 2016-05-14 DIAGNOSIS — M21372 Foot drop, left foot: Secondary | ICD-10-CM | POA: Diagnosis not present

## 2016-05-18 ENCOUNTER — Other Ambulatory Visit: Payer: Self-pay

## 2016-05-18 ENCOUNTER — Telehealth: Payer: Self-pay

## 2016-05-18 DIAGNOSIS — K5090 Crohn's disease, unspecified, without complications: Secondary | ICD-10-CM

## 2016-05-18 DIAGNOSIS — R197 Diarrhea, unspecified: Secondary | ICD-10-CM

## 2016-05-18 DIAGNOSIS — R109 Unspecified abdominal pain: Secondary | ICD-10-CM

## 2016-05-18 NOTE — Telephone Encounter (Signed)
Patient scheduled for MR enterography at [facility] on 11/21 @ 3:00, arrive at 1:30, NPO 4 hours prior.

## 2016-05-21 ENCOUNTER — Other Ambulatory Visit: Payer: Self-pay | Admitting: Medical

## 2016-05-21 NOTE — Telephone Encounter (Signed)
Relation to YT:WKMQ Call back [phone] Pharmacy: [place], [place] [phone] (Phone) [phone] (Fax)     Reason for call:  Patient requesting a refill traZODone (DESYREL) 50 MG tablet

## 2016-05-24 ENCOUNTER — Other Ambulatory Visit: Payer: Self-pay | Admitting: Gastroenterology

## 2016-05-24 DIAGNOSIS — R197 Diarrhea, unspecified: Secondary | ICD-10-CM

## 2016-05-24 DIAGNOSIS — R109 Unspecified abdominal pain: Secondary | ICD-10-CM

## 2016-05-24 DIAGNOSIS — K5090 Crohn's disease, unspecified, without complications: Secondary | ICD-10-CM

## 2016-05-24 MED ORDER — TRAZODONE HCL 50 MG PO TABS: 50.0000 mg | ORAL_TABLET | Freq: Every day | ORAL | 1 refills | Status: DC

## 2016-05-24 NOTE — Telephone Encounter (Signed)
Rx for Trazodone sent to pharmacy and he is aware that his Rx is at the pharmacy. Pt states that he will be going out of town and that he will call back to set up an appointment at a later time for the beginning of the year.

## 2016-05-25 ENCOUNTER — Ambulatory Visit (HOSPITAL_COMMUNITY)
Admission: RE | Admit: 2016-05-25 | Discharge: 2016-05-25 | Disposition: A | Discharge status: Home / Self Care | Payer: BLUE CROSS/BLUE SHIELD | Source: Ambulatory Visit | Attending: Gastroenterology | Admitting: Gastroenterology

## 2016-05-25 ENCOUNTER — Other Ambulatory Visit: Payer: Self-pay | Admitting: Gastroenterology

## 2016-05-25 DIAGNOSIS — K5090 Crohn's disease, unspecified, without complications: Secondary | ICD-10-CM

## 2016-05-25 DIAGNOSIS — R109 Unspecified abdominal pain: Secondary | ICD-10-CM

## 2016-05-25 DIAGNOSIS — D869 Sarcoidosis, unspecified: Secondary | ICD-10-CM | POA: Diagnosis not present

## 2016-05-25 DIAGNOSIS — R197 Diarrhea, unspecified: Secondary | ICD-10-CM

## 2016-05-25 DIAGNOSIS — K8020 Calculus of gallbladder without cholecystitis without obstruction: Secondary | ICD-10-CM | POA: Diagnosis not present

## 2016-05-25 MED ORDER — GADOBENATE DIMEGLUMINE 529 MG/ML IV SOLN: 20.0000 mL | Freq: Once | INTRAVENOUS | Status: DC | PRN

## 2016-06-01 ENCOUNTER — Telehealth: Payer: Self-pay

## 2016-06-01 NOTE — Telephone Encounter (Signed)
Thanks much, really appreciate your help facilitating this for him

## 2016-06-01 NOTE — Telephone Encounter (Signed)
[facility] spoke to nurse, they have worked patient in on Friday, 06/04/16 at 1:45, arrive at 1:30. Patient will see Dr. Junious Silk. MRI report again, faxed over to [phone]. Tried to call patient, had to lvm with appointment information. Told him to call if he has questions or concerns.

## 2016-06-03 ENCOUNTER — Telehealth: Payer: Self-pay

## 2016-06-03 NOTE — Telephone Encounter (Signed)
When I spoke with him he did not have any symptoms concerning for gallstones so I did not think he warranted a surgery and we discussed this for a while. If he wants to see a surgeon about having it removed we can do that, but please reassure him that if he doesn't have symptoms he does not need this. If he does wish to see surgery please let me know with whom he is scheduled so I can discuss with them. thanks

## 2016-06-03 NOTE — Telephone Encounter (Signed)
Patient has an appointment to see Dr. Gurney Maxin on 12/13 at 9:30 at [facility]. [facility] has notified patient of appointment.

## 2016-06-03 NOTE — Telephone Encounter (Signed)
Patient called and he would like to see someone about his gallbladder. I am assuming you would like to refer to [facility]? Let me know, thanks.

## 2016-06-04 ENCOUNTER — Telehealth: Payer: Self-pay

## 2016-06-04 DIAGNOSIS — D414 Neoplasm of uncertain behavior of bladder: Secondary | ICD-10-CM | POA: Diagnosis not present

## 2016-06-04 NOTE — Telephone Encounter (Signed)
Pt has an appt scheduled with Dr Kieth Brightly on 06/16/16 at [place]. Pt was made aware by their office.

## 2016-06-11 ENCOUNTER — Other Ambulatory Visit: Payer: Self-pay | Admitting: Urology

## 2016-06-16 DIAGNOSIS — K8020 Calculus of gallbladder without cholecystitis without obstruction: Secondary | ICD-10-CM | POA: Diagnosis not present

## 2016-06-22 ENCOUNTER — Encounter (HOSPITAL_BASED_OUTPATIENT_CLINIC_OR_DEPARTMENT_OTHER): Payer: Self-pay | Admitting: *Deleted

## 2016-06-22 NOTE — Progress Notes (Signed)
NPO AFTER MN.  ARRIVE AT 0600.  NEEDS HG.  MAY PRN MEDS W/ SIPS OF WATER AM DOS .

## 2016-06-24 ENCOUNTER — Encounter: Payer: Self-pay | Admitting: Medical

## 2016-06-24 ENCOUNTER — Ambulatory Visit (INDEPENDENT_AMBULATORY_CARE_PROVIDER_SITE_OTHER): Payer: BLUE CROSS/BLUE SHIELD | Admitting: Medical

## 2016-06-24 ENCOUNTER — Telehealth: Payer: Self-pay | Admitting: Medical

## 2016-06-24 VITALS — BP 119/77 | HR 89 | Temp 98.4°F | Resp 16 | Ht 74.0 in | Wt 229.2 lb

## 2016-06-24 DIAGNOSIS — K5903 Drug induced constipation: Secondary | ICD-10-CM

## 2016-06-24 DIAGNOSIS — K8020 Calculus of gallbladder without cholecystitis without obstruction: Secondary | ICD-10-CM | POA: Diagnosis not present

## 2016-06-24 DIAGNOSIS — M545 Low back pain: Secondary | ICD-10-CM

## 2016-06-24 DIAGNOSIS — G4700 Insomnia, unspecified: Secondary | ICD-10-CM

## 2016-06-24 DIAGNOSIS — R21 Rash and other nonspecific skin eruption: Secondary | ICD-10-CM

## 2016-06-24 DIAGNOSIS — D414 Neoplasm of uncertain behavior of bladder: Secondary | ICD-10-CM

## 2016-06-24 MED ORDER — CLONAZEPAM 0.5 MG PO TABS
ORAL_TABLET | ORAL | 0 refills | Status: DC
Start: 2016-06-24 — End: 2019-05-28

## 2016-06-24 MED ORDER — TRIAMCINOLONE ACETONIDE 0.1 % EX CREA: 1.0000 "application " | TOPICAL_CREAM | Freq: Two times a day (BID) | CUTANEOUS | 1 refills | Status: DC

## 2016-06-24 MED ORDER — AMMONIUM LACTATE 12 % EX CREA: TOPICAL_CREAM | CUTANEOUS | 1 refills | Status: DC | PRN

## 2016-06-24 MED ORDER — TRAZODONE HCL 50 MG PO TABS: 25.0000 mg | ORAL_TABLET | Freq: Every evening | ORAL | 0 refills | Status: DC | PRN

## 2016-06-24 MED ORDER — METHYLNALTREXONE BROMIDE 150 MG PO TABS: 150.0000 mg | ORAL_TABLET | Freq: Once | ORAL | 0 refills | Status: AC

## 2016-06-24 NOTE — Telephone Encounter (Signed)
I printed pt relistor rx. I think he had mail order pharmacy that he needed it mailed to or we could fax it to local [facility] pharmacy. Will you call pt and check.

## 2016-06-24 NOTE — Patient Instructions (Addendum)
For your gallstones will refer to new surgeon that you want.(Pt will let us know name of surgeon)  Attend the urologist appointment for removing the polyp.  Rx lacyhdrin and kenalog for your leg.Will recheck leg on follow up. Offered referral to derm for biopsy but pt defers presently.  Will refill your trazadone for insomnia.  For opiod constipation will rx relastor.  For your back pain let me know when you run out of the oxycontin. We can get you to sign pain contract and rx norco. But would need you to give uds as well.  For your hip pain if persists might refer to sports med in near future.  Follow up in 3 weeks or as needed

## 2016-06-24 NOTE — Progress Notes (Signed)
Pre visit review using our clinic review tool, if applicable. No additional management support is needed unless otherwise documented below in the visit note. 

## 2016-06-24 NOTE — Progress Notes (Signed)
Subjective:    Patient ID: Derrick Herrera, male    DOB: [birth year], 50 y.o.   MRN: [id number]  HPI   Pt in for evaluation.  Pt give me update on unremarkable colonoscopy.  Pt states chrones is in remission.  Pt states his GI MD order mri abdomen/gi tract. Gallstones were seen. Pt went to surgeon for opinion on his gallstones. Pt states after 30 minutes eating will have pain. Pt states he will get surgery to remove gallstones but he would want it done with other surgeon.   Pt also had polyp in his bladder seen on mri. Pt had cystoscopy to confirmed finding and he will have the polyp removed this coming Tuesday.  Pt is still smoking. He tried chantix and got ulcers in his mouth. He saw commercial that stated side effect and he stopped chantix and his ulcers resolved. He is going to use nicotine patches soon. He had been on wellbutrin in past but still smoked.   Pt also has rt pretibial area mild red. Told in past maybe autoimmune process. Derm offered biopsy. This is usually in November and December. Feels dry and little flaky at times.  Pt in past had rx of trazadone in past.His pharmacy wanted him to have 90 days rx. He states helped him sleep.  Pt has good lipid panel. Only mild lipid panel elevation. No family history of CAD, MI and no stroke.   Also has history of opiod induced constipation. Relastor has helped. Orthopedist wrote med. But 90 days rx required by his insurance. Pt states without relastor takes 30 minutes to go at worst.  Pt had back pain in the past. He is on lyrica for the pain. Pt is on oxycontin. Surgeon is writing. Pt states he feels like his pain has not improved. He wants to consider other medication. He does not mind trying something less potent if it works.  Pt also has left hip area pain. Told by PT bursae. Some improvement with therapy.    Review of Systems  Constitutional: Negative for chills and fatigue.  Respiratory: Negative for cough, choking,  shortness of breath and wheezing.   Cardiovascular: Negative for chest pain and palpitations.  Gastrointestinal: Positive for abdominal pain. Negative for diarrhea, nausea and vomiting.       After eating. But not now.  Genitourinary: Negative for dysuria, flank pain, frequency, hematuria, penile pain, scrotal swelling and urgency.  Musculoskeletal: Positive for back pain. Negative for myalgias.       Lt hip pain at times.  Skin: Positive for rash. Negative for pallor.  Neurological: Negative for dizziness and headaches.  Hematological: Negative for adenopathy. Does not bruise/bleed easily.  Psychiatric/Behavioral: Positive for sleep disturbance. Negative for behavioral problems and confusion. The patient is not nervous/anxious.      Past Medical History:  Diagnosis Date  . Bladder neoplasm   . Chronic back pain   . Crohn's disease ([place])    dx 1994--  followed by dr Havery Moros -- no current treatment  . Eczema   . GAD (generalized anxiety disorder)   . History of colon polyps    benign 11/ 2017  . History of lower GI bleeding    1995  and 2003 (ileitis)  . 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 Wears glasses      Social History   Social History  . Marital status:  Single    Spouse name: N/A  . Number of children: N/A  . Years of education: N/A   Occupational History  . Not on file.   Social History Main Topics  . Smoking status: Current Every Day Smoker    Packs/day: 0.75    Years: 10.00    Types: Cigarettes  . Smokeless tobacco: Never Used     Comment: pt trying to quit -- taking chantix and using nicoderm patch  . Alcohol use Yes     Comment: occasional  . Drug use: No  . Sexual activity: No   Other Topics Concern  . Not on file   Social History Narrative  . No narrative on file    Past Surgical History:  Procedure Laterality Date  . BRONCHOSCOPY  03/22/2008   dr  Tarri Fuller young   w/ BX  . COLONOSCOPY  last one 05-10-2016  . LUMBAR DISC SURGERY  02/27/2016   L4-L5    Family History  Problem Relation Age of Onset  . Colon cancer Maternal Grandmother   . Diabetes Mother   . Esophageal cancer Neg Hx   . Stomach cancer Neg Hx     Allergies  Allergen Reactions  . Nsaids Other (See Comments)    Remote hx lower GI bleed    Current Outpatient Prescriptions on File Prior to Visit  Medication Sig Dispense Refill  . clonazePAM (KLONOPIN) 0.5 MG tablet 1 tab po bid prn anxiety (Patient taking differently: Take 0.5 mg by mouth 2 (two) times daily as needed. 1 tab po bid prn anxiety) 30 tablet 0  . hyoscyamine (LEVSIN SL) 0.125 MG SL tablet Take 0.125 mg by mouth every 4 (four) hours as needed.     . methocarbamol (ROBAXIN) 500 MG tablet     . nicotine (NICODERM CQ - DOSED IN MG/24 HOURS) 14 mg/24hr patch Place 1 patch (14 mg total) onto the skin daily. 28 patch 0  . oxyCODONE (OXYCONTIN) 10 mg 12 hr tablet Take 10 mg by mouth 2 (two) times daily as needed. Every 12 hours    . traMADol (ULTRAM) 50 MG tablet Take 50 mg by mouth every 12 (twelve) hours as needed.     . traZODone (DESYREL) 50 MG tablet Take 1 tablet (50 mg total) by mouth at bedtime. (Patient taking differently: Take 50 mg by mouth at bedtime as needed. ) 30 tablet 1   No current facility-administered medications on file prior to visit.     BP 119/77 (BP Location: Right Arm, Cuff Size: Normal)   Pulse 89   Temp 98.4 F (36.9 C) (Oral)   Resp 16   Ht 6' 2"  (1.88 m)   Wt 229 lb 3.2 oz (104 kg)   SpO2 98%   BMI 29.43 kg/m       Objective:   Physical Exam  General Mental Status- Alert. General Appearance- Not in acute distress.   Skin General: Color- Normal Color. Moisture- Normal Moisture.  Neck Carotid Arteries- Normal color. Moisture- Normal Moisture. No carotid bruits. No JVD.  Chest and Lung Exam Auscultation: Breath  Sounds:-Normal.  Cardiovascular Auscultation:Rythm- Regular. Murmurs & Other Heart Sounds:Auscultation of the heart reveals- No Murmurs.  Abdomen Inspection:-Inspeection Normal. Palpation/Percussion:Note:No mass. Palpation and Percussion of the abdomen reveal- Non Tender, Non Distended + BS, no rebound or guarding.   Neurologic Cranial Nerve exam:- CN III-XII intact(No nystagmus), symmetric smile. Strength:- 5/5 equal and symmetric strength both upper and lower extremities.  Rt lower ext- faint red rash and  mild flaky. Feel dry but not warm. Area covered 10 cm by 5 cm region.    Assessment & Plan:  For your gallstones will refer to new surgeon that you want.(Pt will let us know name of the surgeon)  Attend the urologist appointment for removing the polyp.  Rx lacyhdrin and kenalog for your leg. Offered referral to derm for biopsy but pt defers presently.  Will refill your trazadone for insomnia.  For opiod constipation will rx relastor.  For your back pain let me know when you run out of the oxycontin. We can get you to sign pain contract and rx norco. But would need you to give uds as well.  For your hip pain if persists might refer to sports med in near future.  Follow up in 3 weeks or as needed

## 2016-06-24 NOTE — Telephone Encounter (Signed)
Left a message on pt's vm to give the office a call back, in regards to which pharmacy he would like his Rx to go to. LB

## 2016-06-24 NOTE — Telephone Encounter (Signed)
Patient called back and stated he would like rx sent to [facility].    Rx faxed.

## 2016-06-25 ENCOUNTER — Telehealth: Payer: Self-pay | Admitting: *Deleted

## 2016-06-25 MED ORDER — NALOXEGOL OXALATE 25 MG PO TABS: 25.0000 mg | ORAL_TABLET | Freq: Every day | ORAL | 1 refills | Status: DC

## 2016-06-25 NOTE — Telephone Encounter (Signed)
Checked with [facility] pharmacist if movantik controled med. She stated not controlled after looking up so did give refill.

## 2016-06-25 NOTE — Telephone Encounter (Signed)
[facility] [place] sent over a prior auth for [employer].  Called insurance and they state that they will pay for Movantik at no cost to patient.  Called patient and he stated that he would try that medication.  Can you send in a new rx to [facility] for the [place]?

## 2016-06-27 NOTE — H&P (Signed)
Office Visit Report     06/04/2016   --------------------------------------------------------------------------------   Derrick Herrera  MRN: [id number]  PRIMARY CARE:    DOB: March 12, 1966, 50 year old Male  REFERRING:    SSN: [id number]  PROVIDER:  Festus Aloe, M.D.    LOCATION:  [facility] - [phone]   --------------------------------------------------------------------------------   CC/HPI: Pt seen for bladder mass. He underwent MRI May 25, 2016 for abd pain in the setting of Crohn's and Sarcoidosis.   He has no voiding complaints. Noc x 1. He drinks water up to bed time. No gross hematuria. He is a smoker.     ALLERGIES: NSAIDs    MEDICATIONS: Klonopin  Levsin  Lyrica  Oxycodone-Acetaminophen  Trazodone Hcl     GU PSH: No GU PSH      PSH Notes: No Surgical Problems   NON-GU PSH: No Non-GU PSH    GU PMH: Chronic prostatitis, Chronic prostatitis - 03/14/2015 Spermatocele of epididymis, Unspec, Spermatocele - 03/14/2015 Testicular pain, unspecified, Testalgia - 03/14/2015    NON-GU PMH: Encounter for general adult medical examination without abnormal findings, Encounter for preventive health examination - 03/14/2015 Personal history of other diseases of the digestive system, History of Crohn's disease - 03/14/2015    FAMILY HISTORY: cardiac disorder - Runs In Family   SOCIAL HISTORY: No Social History     Notes: Current every day smoker, Caffeine use, Occupation, Single, Number of children, Alcohol use   REVIEW OF SYSTEMS:    GU Review Male:   Patient reports get up at night to urinate. Patient denies frequent urination, hard to postpone urination, burning/ pain with urination, leakage of urine, stream starts and stops, trouble starting your stream, have to strain to urinate , erection problems, and penile pain.  Gastrointestinal (Upper):   Patient reports nausea, vomiting, and indigestion/ heartburn.   Gastrointestinal (Lower):   Patient reports  diarrhea and constipation.   Constitutional:   Patient reports fatigue. Patient denies fever, night sweats, and weight loss.  Skin:   Patient denies skin rash/ lesion and itching.  Eyes:   Patient denies blurred vision and double vision.  Ears/ Nose/ Throat:   Patient denies sore throat and sinus problems.  Hematologic/Lymphatic:   Patient denies swollen glands and easy bruising.  Cardiovascular:   Patient denies leg swelling and chest pains.  Respiratory:   Patient denies shortness of breath and cough.  Endocrine:   Patient denies excessive thirst.  Musculoskeletal:   Patient reports back pain. Patient denies joint pain.  Neurological:   Patient denies headaches and dizziness.  Psychologic:   Patient denies depression and anxiety.   VITAL SIGNS:      06/04/2016 01:51 PM  Weight 225 lb / 102.06 kg  Height 74 in / 187.96 cm  BP 127/77 mmHg  Pulse 106 /min  Temperature 97.2 F / 36 C  BMI 28.9 kg/m   GU PHYSICAL EXAMINATION:    Anus and Perineum: No hemorrhoids. No anal stenosis. No rectal fissure, no anal fissure. No edema, no dimple, no perineal tenderness, no anal tenderness.  Scrotum: No lesions. No edema. No cysts. No warts.  Epididymides: Right: no spermatocele, no masses, no cysts, no tenderness, no induration, no enlargement. Left: no spermatocele, no masses, no cysts, no tenderness, no induration, no enlargement.  Testes: No tenderness, no swelling, no enlargement left testes. No tenderness, no swelling, no enlargement right testes. Normal location left testes. Normal location right testes. No mass, no cyst, no varicocele, no hydrocele left  testes. No mass, no cyst, no varicocele, no hydrocele right testes.  Urethral Meatus: Normal size. No lesion, no wart, no discharge, no polyp. Normal location.  Penis: Circumcised, no warts, no cracks. No dorsal Peyronie's plaques, no left corporal Peyronie's plaques, no right corporal Peyronie's plaques, no scarring, no warts. No balanitis, no  meatal stenosis.  Prostate: Prostate about 20 grams. Left lobe normal consistency, right lobe normal consistency. Symmetrical lobes. No prostate nodule. Left lobe no tenderness, right lobe no tenderness.   Seminal Vesicles: Nonpalpable.  Sphincter Tone: Normal sphincter. No rectal tenderness. No rectal mass.    MULTI-SYSTEM PHYSICAL EXAMINATION:    Constitutional: Well-nourished. No physical deformities. Normally developed. Good grooming.  Neck: Neck symmetrical, not swollen. Normal tracheal position.  Respiratory: No labored breathing, no use of accessory muscles.   Cardiovascular: Normal temperature, normal extremity pulses, no swelling, no varicosities.  Skin: No paleness, no jaundice, no cyanosis. No lesion, no ulcer, no rash.  Neurologic / Psychiatric: Oriented to time, oriented to place, oriented to person. No depression, no anxiety, no agitation.     PAST DATA REVIEWED:  Source Of History:  Patient   PROCEDURES:         Flexible Cystoscopy - 52000  Risks, benefits, and some of the potential complications of the procedure were discussed with the patient. All questions were answered. Informed consent was obtained. Antibiotic prophylaxis was given -- Cipro. Sterile technique and intraurethral analgesia were used.  Meatus:  Normal size. Normal location. Normal condition.  Urethra:  No strictures.  External Sphincter:  Normal.  Verumontanum:  Normal.  Prostate:  Non-obstructing. No hyperplasia.  Bladder Neck:  Non-obstructing.  Ureteral Orifices:  Normal location. Normal size. Normal shape. Effluxed clear urine.  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 ~2 cm. No trabeculation. Normal mucosa. No stones.      The lower urinary tract was carefully examined. The procedure was well-tolerated and without complications. Antibiotic instructions were given. Instructions were given to call the office immediately for bloody urine, difficulty urinating,  painful urination, fever, chills, nausea, vomiting or other illness. The patient stated that he understood these instructions and would comply with them.         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 He elects to proceed. Discussed risk of bleeding and infection and perforation among others. Discussed possible need for ureteral stent and postop Foley. We also discussed the nature risk and benefits of epirubicin intravesical and he elects to proceed.   Discussed link between smoking and bladder and kidney cancer. I recommended he stop smoking.   cc: Dr. Havery Moros       * Signed by Festus Aloe, M.D. on 06/04/16 at 4:23 PM (EST)*  The information contained in this medical record document is considered private and confidential patient information. This information can only be used for the medical diagnosis and/or medical services that are being provided by the patient's selected caregivers. This information can only be distributed outside of the patient's care if the patient agrees and signs waivers of authorization for this information to be sent to an outside source or route.

## 2016-06-28 NOTE — Anesthesia Preprocedure Evaluation (Addendum)
Anesthesia Evaluation  Patient identified by MRN, date of birth, ID band Patient awake    Reviewed: Allergy & Precautions, H&P , Patient's Chart, lab work & pertinent test results, reviewed documented beta blocker date and time   Airway Mallampati: II  TM Distance: >3 FB Neck ROM: full    Dental no notable dental hx.    Pulmonary neg pulmonary ROS, Current Smoker,    Pulmonary exam normal breath sounds clear to auscultation       Cardiovascular  Rhythm:regular Rate:Normal     Neuro/Psych Anxiety negative neurological ROS     GI/Hepatic negative GI ROS, Neg liver ROS,   Endo/Other  negative endocrine ROS  Renal/GU negative Renal ROS  negative genitourinary   Musculoskeletal negative musculoskeletal ROS (+)   Abdominal   Peds  Hematology negative hematology ROS (+)   Anesthesia Other Findings   Reproductive/Obstetrics negative OB ROS                            Anesthesia Physical Anesthesia Plan  ASA: II  Anesthesia Plan: General   Post-op Pain Management:    Induction: Intravenous  Airway Management Planned: LMA  Additional Equipment:   Intra-op Plan:   Post-operative Plan: Extubation in OR  Informed Consent: I have reviewed the patients History and Physical, chart, labs and discussed the procedure including the risks, benefits and alternatives for the proposed anesthesia with the patient or authorized representative who has indicated his/her understanding and acceptance.   Dental Advisory Given and Dental advisory given  Plan Discussed with: CRNA and Surgeon  Anesthesia Plan Comments: (  Discussed general anesthesia, including possible nausea, instrumentation of airway, sore throat,pulmonary aspiration, etc. I asked if the were any outstanding questions, or  concerns before we proceeded.)        Anesthesia Quick Evaluation

## 2016-06-29 ENCOUNTER — Encounter (HOSPITAL_BASED_OUTPATIENT_CLINIC_OR_DEPARTMENT_OTHER): Payer: Self-pay | Admitting: *Deleted

## 2016-06-29 ENCOUNTER — Ambulatory Visit (HOSPITAL_BASED_OUTPATIENT_CLINIC_OR_DEPARTMENT_OTHER): Payer: BLUE CROSS/BLUE SHIELD | Admitting: Anesthesiology

## 2016-06-29 ENCOUNTER — Ambulatory Visit (HOSPITAL_BASED_OUTPATIENT_CLINIC_OR_DEPARTMENT_OTHER)
Admission: RE | Admit: 2016-06-29 | Discharge: 2016-06-29 | Disposition: A | Discharge status: Home / Self Care | Payer: BLUE CROSS/BLUE SHIELD | Source: Ambulatory Visit | Attending: Urology | Admitting: Urology

## 2016-06-29 ENCOUNTER — Encounter (HOSPITAL_BASED_OUTPATIENT_CLINIC_OR_DEPARTMENT_OTHER)
Admission: RE | Disposition: A | Discharge status: Home / Self Care | Payer: Self-pay | Source: Ambulatory Visit | Attending: Urology

## 2016-06-29 DIAGNOSIS — D494 Neoplasm of unspecified behavior of bladder: Secondary | ICD-10-CM | POA: Diagnosis not present

## 2016-06-29 DIAGNOSIS — Z79891 Long term (current) use of opiate analgesic: Secondary | ICD-10-CM | POA: Diagnosis not present

## 2016-06-29 DIAGNOSIS — Z8249 Family history of ischemic heart disease and other diseases of the circulatory system: Secondary | ICD-10-CM | POA: Insufficient documentation

## 2016-06-29 DIAGNOSIS — D869 Sarcoidosis, unspecified: Secondary | ICD-10-CM | POA: Diagnosis not present

## 2016-06-29 DIAGNOSIS — Z79899 Other long term (current) drug therapy: Secondary | ICD-10-CM | POA: Insufficient documentation

## 2016-06-29 DIAGNOSIS — J329 Chronic sinusitis, unspecified: Secondary | ICD-10-CM | POA: Diagnosis not present

## 2016-06-29 DIAGNOSIS — D489 Neoplasm of uncertain behavior, unspecified: Secondary | ICD-10-CM | POA: Diagnosis present

## 2016-06-29 DIAGNOSIS — Z886 Allergy status to analgesic agent status: Secondary | ICD-10-CM | POA: Insufficient documentation

## 2016-06-29 DIAGNOSIS — K5090 Crohn's disease, unspecified, without complications: Secondary | ICD-10-CM | POA: Insufficient documentation

## 2016-06-29 DIAGNOSIS — R599 Enlarged lymph nodes, unspecified: Secondary | ICD-10-CM | POA: Diagnosis not present

## 2016-06-29 DIAGNOSIS — D499 Neoplasm of unspecified behavior of unspecified site: Secondary | ICD-10-CM | POA: Diagnosis not present

## 2016-06-29 DIAGNOSIS — C672 Malignant neoplasm of lateral wall of bladder: Secondary | ICD-10-CM | POA: Insufficient documentation

## 2016-06-29 DIAGNOSIS — F17200 Nicotine dependence, unspecified, uncomplicated: Secondary | ICD-10-CM | POA: Diagnosis not present

## 2016-06-29 HISTORY — DX: Personal history of colonic polyps: Z86.010

## 2016-06-29 HISTORY — DX: Other chronic pain: G89.29

## 2016-06-29 HISTORY — DX: Dermatitis, unspecified: L30.9

## 2016-06-29 HISTORY — DX: Presence of spectacles and contact lenses: Z97.3

## 2016-06-29 HISTORY — DX: Personal history of colon polyps, unspecified: Z86.0100

## 2016-06-29 HISTORY — DX: Sarcoidosis of lung: D86.0

## 2016-06-29 HISTORY — DX: Neoplasm of unspecified behavior of bladder: D49.4

## 2016-06-29 HISTORY — DX: Personal history of other diseases of the digestive system: Z87.19

## 2016-06-29 HISTORY — DX: Dorsalgia, unspecified: M54.9

## 2016-06-29 HISTORY — DX: Generalized anxiety disorder: F41.1

## 2016-06-29 HISTORY — PX: TRANSURETHRAL RESECTION OF BLADDER TUMOR WITH MITOMYCIN-C: SHX6459

## 2016-06-29 LAB — POCT HEMOGLOBIN-HEMACUE: Hemoglobin: 13.7 g/dL (ref 13.0–17.0)

## 2016-06-29 SURGERY — TRANSURETHRAL RESECTION OF BLADDER TUMOR WITH MITOMYCIN-C
Anesthesia: General | Site: Bladder

## 2016-06-29 MED ORDER — MIDAZOLAM HCL 5 MG/5ML IJ SOLN
INTRAMUSCULAR | Status: DC | PRN
  Administered 2016-06-29: 2 mg via INTRAVENOUS

## 2016-06-29 MED ORDER — FENTANYL CITRATE (PF) 100 MCG/2ML IJ SOLN
INTRAMUSCULAR | Status: DC | PRN
  Administered 2016-06-29 (×2): 50 ug via INTRAVENOUS

## 2016-06-29 MED ORDER — SUCCINYLCHOLINE CHLORIDE 200 MG/10ML IV SOSY
PREFILLED_SYRINGE | INTRAVENOUS | Status: AC
  Filled 2016-06-29: qty 10

## 2016-06-29 MED ORDER — SODIUM CHLORIDE 0.9 % IR SOLN
Status: DC | PRN
  Administered 2016-06-29: 9000 mL

## 2016-06-29 MED ORDER — CEFAZOLIN SODIUM-DEXTROSE 2-4 GM/100ML-% IV SOLN
INTRAVENOUS | Status: AC
  Filled 2016-06-29: qty 100

## 2016-06-29 MED ORDER — OXYCODONE-ACETAMINOPHEN 5-325 MG PO TABS
2.0000 | ORAL_TABLET | Freq: Once | ORAL | Status: AC
  Administered 2016-06-29: 2 via ORAL
  Filled 2016-06-29: qty 2

## 2016-06-29 MED ORDER — ONDANSETRON HCL 4 MG/2ML IJ SOLN
INTRAMUSCULAR | Status: AC
Start: 2016-06-29 — End: 2016-06-29
  Filled 2016-06-29: qty 2

## 2016-06-29 MED ORDER — ONDANSETRON HCL 4 MG/2ML IJ SOLN
INTRAMUSCULAR | Status: AC
  Filled 2016-06-29: qty 2

## 2016-06-29 MED ORDER — CEFAZOLIN SODIUM-DEXTROSE 2-4 GM/100ML-% IV SOLN
2.0000 g | INTRAVENOUS | Status: AC
  Administered 2016-06-29: 2 g via INTRAVENOUS
  Filled 2016-06-29: qty 100

## 2016-06-29 MED ORDER — FENTANYL CITRATE (PF) 100 MCG/2ML IJ SOLN
INTRAMUSCULAR | Status: AC
  Filled 2016-06-29: qty 2

## 2016-06-29 MED ORDER — LIDOCAINE 2% (20 MG/ML) 5 ML SYRINGE
INTRAMUSCULAR | Status: AC
  Filled 2016-06-29: qty 5

## 2016-06-29 MED ORDER — PROPOFOL 10 MG/ML IV BOLUS
INTRAVENOUS | Status: DC | PRN
  Administered 2016-06-29: 50 mg via INTRAVENOUS
  Administered 2016-06-29: 200 mg via INTRAVENOUS

## 2016-06-29 MED ORDER — PROPOFOL 10 MG/ML IV BOLUS
INTRAVENOUS | Status: AC
  Filled 2016-06-29: qty 40

## 2016-06-29 MED ORDER — CEFAZOLIN IN D5W 1 GM/50ML IV SOLN
1.0000 g | INTRAVENOUS | Status: DC
  Filled 2016-06-29: qty 50

## 2016-06-29 MED ORDER — LACTATED RINGERS IV SOLN
INTRAVENOUS | Status: DC
  Administered 2016-06-29: 07:00:00 via INTRAVENOUS
  Filled 2016-06-29: qty 1000

## 2016-06-29 MED ORDER — ONDANSETRON HCL 4 MG/2ML IJ SOLN
INTRAMUSCULAR | Status: DC | PRN
  Administered 2016-06-29: 4 mg via INTRAVENOUS

## 2016-06-29 MED ORDER — LIDOCAINE 2% (20 MG/ML) 5 ML SYRINGE
INTRAMUSCULAR | Status: DC | PRN
  Administered 2016-06-29: 100 mg via INTRAVENOUS

## 2016-06-29 MED ORDER — FENTANYL CITRATE (PF) 100 MCG/2ML IJ SOLN
INTRAMUSCULAR | Status: AC
  Filled 2016-06-29: qty 4

## 2016-06-29 MED ORDER — OXYCODONE-ACETAMINOPHEN 5-325 MG PO TABS
ORAL_TABLET | ORAL | Status: AC
  Filled 2016-06-29: qty 2

## 2016-06-29 MED ORDER — DEXAMETHASONE SODIUM PHOSPHATE 10 MG/ML IJ SOLN
INTRAMUSCULAR | Status: AC
  Filled 2016-06-29: qty 1

## 2016-06-29 MED ORDER — EPIRUBICIN HCL CHEMO IV INJECTION 50 MG/25ML
50.0000 mg | Freq: Once | INTRAVENOUS | Status: AC
  Administered 2016-06-29: 50 mg via INTRAVESICAL
  Filled 2016-06-29 (×2): qty 25

## 2016-06-29 MED ORDER — FENTANYL CITRATE (PF) 100 MCG/2ML IJ SOLN
25.0000 ug | INTRAMUSCULAR | Status: DC | PRN
  Administered 2016-06-29: 25 ug via INTRAVENOUS
  Administered 2016-06-29: 50 ug via INTRAVENOUS
  Administered 2016-06-29: 25 ug via INTRAVENOUS
  Administered 2016-06-29: 50 ug via INTRAVENOUS
  Filled 2016-06-29: qty 1

## 2016-06-29 MED ORDER — MIDAZOLAM HCL 2 MG/2ML IJ SOLN
INTRAMUSCULAR | Status: AC
  Filled 2016-06-29: qty 2

## 2016-06-29 MED ORDER — DEXAMETHASONE SODIUM PHOSPHATE 4 MG/ML IJ SOLN
INTRAMUSCULAR | Status: DC | PRN
  Administered 2016-06-29: 10 mg via INTRAVENOUS

## 2016-06-29 SURGICAL SUPPLY — 34 items
BAG DRAIN URO-CYSTO SKYTR STRL (DRAIN) ×2 IMPLANT
BAG DRN ANRFLXCHMBR STRAP LEK (BAG)
BAG DRN UROCATH (DRAIN) ×1
BAG URINE DRAINAGE (UROLOGICAL SUPPLIES) ×1 IMPLANT
BAG URINE LEG 19OZ MD ST LTX (BAG) IMPLANT
CATH FOLEY 2WAY SLVR  5CC 16FR (CATHETERS) ×1
CATH FOLEY 2WAY SLVR  5CC 20FR (CATHETERS)
CATH FOLEY 2WAY SLVR  5CC 22FR (CATHETERS)
CATH FOLEY 2WAY SLVR 5CC 16FR (CATHETERS) IMPLANT
CATH FOLEY 2WAY SLVR 5CC 20FR (CATHETERS) IMPLANT
CATH FOLEY 2WAY SLVR 5CC 22FR (CATHETERS) IMPLANT
CLOTH BEACON ORANGE TIMEOUT ST (SAFETY) ×2 IMPLANT
DRSG TELFA 3X8 NADH (GAUZE/BANDAGES/DRESSINGS) IMPLANT
ELECT BUTTON BIOP 24F 90D PLAS (MISCELLANEOUS) IMPLANT
ELECT REM PT RETURN 9FT ADLT (ELECTROSURGICAL) ×2
ELECTRODE REM PT RTRN 9FT ADLT (ELECTROSURGICAL) ×1 IMPLANT
EVACUATOR MICROVAS BLADDER (UROLOGICAL SUPPLIES) IMPLANT
GLOVE BIO SURGEON STRL SZ7.5 (GLOVE) ×2 IMPLANT
GLOVE BIOGEL PI IND STRL 7.5 (GLOVE) IMPLANT
GLOVE BIOGEL PI INDICATOR 7.5 (GLOVE) ×2
GOWN STRL REUS W/ TWL XL LVL3 (GOWN DISPOSABLE) ×1 IMPLANT
GOWN STRL REUS W/TWL LRG LVL3 (GOWN DISPOSABLE) ×1 IMPLANT
GOWN STRL REUS W/TWL XL LVL3 (GOWN DISPOSABLE) ×2
HOLDER FOLEY CATH W/STRAP (MISCELLANEOUS) ×1 IMPLANT
IV NS IRRIG 3000ML ARTHROMATIC (IV SOLUTION) ×3 IMPLANT
KIT ROOM TURNOVER WOR (KITS) ×2 IMPLANT
LOOP CUT BIPOLAR 24F LRG (ELECTROSURGICAL) ×1 IMPLANT
MANIFOLD NEPTUNE II (INSTRUMENTS) ×1 IMPLANT
PACK CYSTO (CUSTOM PROCEDURE TRAY) ×2 IMPLANT
PAD DRESSING TELFA 3X8 NADH (GAUZE/BANDAGES/DRESSINGS) IMPLANT
PLUG CATH AND CAP STER (CATHETERS) IMPLANT
SET ASPIRATION TUBING (TUBING) IMPLANT
TUBE CONNECTING 12X1/4 (SUCTIONS) IMPLANT
WATER STERILE IRR 500ML POUR (IV SOLUTION) ×1 IMPLANT

## 2016-06-29 NOTE — Transfer of Care (Signed)
Immediate Anesthesia Transfer of Care Note  Patient: Derrick Herrera  Procedure(s) Performed: Procedure(s): TRANSURETHRAL RESECTION OF BLADDER TUMOR WITH EPIRUBICIN (N/A)  Patient Location: PACU  Anesthesia Type:General  Level of Consciousness: sedated and patient cooperative  Airway & Oxygen Therapy: Patient Spontanous Breathing and Patient connected to nasal cannula oxygen  Post-op Assessment: Report given to RN and Post -op Vital signs reviewed and stable  Post vital signs: Reviewed and stable  Last Vitals:  Vitals:   06/29/16 0614  BP: 118/70  Pulse: 88  Resp: 16  Temp: 36.5 C    Last Pain:  Vitals:   06/29/16 0618  PainSc: 3       Patients Stated Pain Goal: 7 (37/02/30 1720)  Complications: No apparent anesthesia complications

## 2016-06-29 NOTE — Anesthesia Procedure Notes (Signed)
Procedure Name: LMA Insertion Date/Time: 06/29/2016 7:43 AM Performed by: Wanita Chamberlain Pre-anesthesia Checklist: Patient identified, Timeout performed, Emergency Drugs available, Suction available and Patient being monitored Patient Re-evaluated:Patient Re-evaluated prior to inductionOxygen Delivery Method: Circle system utilized Preoxygenation: Pre-oxygenation with 100% oxygen Intubation Type: IV induction Ventilation: Mask ventilation without difficulty LMA: LMA inserted LMA Size: 5.0 Number of attempts: 1 Airway Equipment and Method: Bite block Placement Confirmation: positive ETCO2

## 2016-06-29 NOTE — Interval H&P Note (Signed)
History and Physical Interval Note:  06/29/2016 7:25 AM  Derrick Herrera  has presented today for surgery, with the diagnosis of bladder neoplasm  The various methods of treatment have been discussed with the patient and family. After consideration of risks, benefits and other options for treatment, the patient has consented to  Procedure(s): TRANSURETHRAL RESECTION OF BLADDER TUMOR WITH EPIRUBICIN (N/A) as a surgical intervention .  The patient's history has been reviewed, patient examined, no change in status, stable for surgery.  I have reviewed the patient's chart and labs.  Questions were answered to the patient's satisfaction.  Discussed again possible need for stent, foley. Discussed again nature r/b of epirubicin.    Cortlin Marano

## 2016-06-29 NOTE — Discharge Instructions (Signed)
Post Anesthesia Home Care Instructions  Activity: Get plenty of rest for the remainder of the day. A responsible adult should stay with you for 24 hours following the procedure.  For the next 24 hours, DO NOT: -Drive a car -Paediatric nurse -Drink alcoholic beverages -Take any medication unless instructed by your physician -Make any legal decisions or sign important papers.  Meals: Start with liquid foods such as gelatin or soup. Progress to regular foods as tolerated. Avoid greasy, spicy, heavy foods. If nausea and/or vomiting occur, drink only clear liquids until the nausea and/or vomiting subsides. Call your physician if vomiting continues.  Special Instructions/Symptoms: Your throat may feel dry or sore from the anesthesia or the breathing tube placed in your throat during surgery. If this causes discomfort, gargle with warm salt water. The discomfort should disappear within 24 hours.  If you had a scopolamine patch placed behind your ear for the management of post- operative nausea and/or vomiting:  1. The medication in the patch is effective for 72 hours, after which it should be removed.  Wrap patch in a tissue and discard in the trash. Wash hands thoroughly with soap and water. 2. You may remove the patch earlier than 72 hours if you experience unpleasant side effects which may include dry mouth, dizziness or visual disturbances. 3. Avoid touching the patch. Wash your hands with soap and water after contact with the patch.   Cystoscopy, Care After Refer to this sheet in the next few weeks. These instructions provide you with information about caring for yourself after your procedure. Your health care provider may also give you more specific instructions. Your treatment has been planned according to current medical practices, but problems sometimes occur. Call your health care provider if you have any problems or questions after your procedure. What can I expect after the  procedure? After the procedure, it is common to have:  Mild pain when you urinate. Pain should stop within a few minutes after you urinate. This may last for up to 1 week.  A small amount of blood in your urine for several days.  Feeling like you need to urinate but producing only a small amount of urine. Follow these instructions at home:   Medicines  Take over-the-counter and prescription medicines only as told by your health care provider.  If you were prescribed an antibiotic medicine, take it as told by your health care provider. Do not stop taking the antibiotic even if you start to feel better. General instructions  Return to your normal activities as told by your health care provider. Ask your health care provider what activities are safe for you.  Do not drive for 24 hours if you received a sedative.  Watch for any blood in your urine. If the amount of blood in your urine increases, call your health care provider.  Follow instructions from your health care provider about eating or drinking restrictions.  If a tissue sample was removed for testing (biopsy) during your procedure, it is your responsibility to get your test results. Ask your health care provider or the department performing the test when your results will be ready.  Drink enough fluid to keep your urine clear or pale yellow.  Keep all follow-up visits as told by your health care provider. This is important. Contact a health care provider if:  You have pain that gets worse or does not get better with medicine, especially pain when you urinate.  You have difficulty urinating. Get help right  away if:  You have more blood in your urine.  You have blood clots in your urine.  You have abdominal pain.  You have a fever or chills.  You are unable to urinate. This information is not intended to replace advice given to you by your health care provider. Make sure you discuss any questions you have with your  health care provider. Document Released: 01/08/2005 Document Revised: 11/27/2015 Document Reviewed: 05/08/2015 Elsevier Interactive Patient Education  2017 [employer].

## 2016-06-29 NOTE — Op Note (Signed)
Preoperative diagnosis: Bladder neoplasm of uncertain behavior Postoperative diagnosis: Same  Procedure: TURBT 2-5 cm, postop installation of epirubicin in PACU  Surgeon: Junious Silk  Anesthesia: Gen.  Indication for procedure: 50 year old white male underwent MRI scan of the abdomen and pelvis which revealed a papillary bladder tumor. This tumor was seen on office cystoscopy when he was brought for the above procedures.  Findings: On exam under anesthesia the penis was circumcised without mass or lesion. The testicles were descended bilaterally and palpably normal. The prostate was small and benign on DRE. No hard areas or nodules.  On cystoscopy the urethra and prostatic urethra were unremarkable. The trigone and ureteral orifices were in their normal orthotopic position. There was clear efflux bilaterally. There was a papillary tumor lateral to the right ureteral orifice. There was early papillary formations lateral to the left ureteral orifice. There was cystitis changes along the trigone with multiple small clear single  glandular formations forming a cobblestone appearance. There was no erythema.   Description of procedure: After consent was obtained patient brought to the operating room. After adequate anesthesia he is placed in lithotomy position prepped and draped in the usual sterile fashion. A timeout was performed to confirm the patient and procedure. An exam under anesthesia was performed. The cystoscope was passed per urethra and the bladder inspected with the 30 and 70 lens. The right bladder tumors then resected with the loop. I sent the tumor and the base separate. The papillary formations close to the left ureteral orifice were ablated and the one more lateral biopsied with the rigid biopsy forceps. The biopsy site was fulgurated. There were no other suspicious lesions. Hemostasis was excellent at low-pressure. The bladder was filled and the scope removed. A 16 French Foley was placed  and left gravity drainage. He was awakened taken to the recovery room in stable condition.   Installation of epirubicin: In the PACU epirubicin was instilled into the bladder and left to dwell for 50 minutes.   Complications: None   Blood loss: Minimal   Specimens to pathology:  #1 right lateral bladder tumor  #2 right lateral bladder tumor base  #3 left lateral bladder biopsy   Drains: 16 French Foley   Disposition: Patient stable to PACU

## 2016-06-29 NOTE — Anesthesia Postprocedure Evaluation (Signed)
Anesthesia Post Note  Patient: Derrick Herrera  Procedure(s) Performed: Procedure(s) (LRB): TRANSURETHRAL RESECTION OF BLADDER TUMOR WITH EPIRUBICIN (N/A)  Patient location during evaluation: PACU Anesthesia Type: General Level of consciousness: sedated Pain management: satisfactory to patient Vital Signs Assessment: post-procedure vital signs reviewed and stable Respiratory status: spontaneous breathing Cardiovascular status: stable Anesthetic complications: no       Last Vitals:  Vitals:   06/29/16 1015 06/29/16 1052  BP: 123/65 116/61  Pulse: 92 87  Resp: (!) 22 18  Temp:  36.6 C    Last Pain:  Vitals:   06/29/16 1052  TempSrc: Oral  PainSc:                  Riccardo Dubin

## 2016-06-30 ENCOUNTER — Encounter (HOSPITAL_BASED_OUTPATIENT_CLINIC_OR_DEPARTMENT_OTHER): Payer: Self-pay | Admitting: Urology

## 2016-06-30 LAB — POCT HEMOGLOBIN-HEMACUE: Hemoglobin: 16.2 g/dL (ref 13.0–17.0)

## 2016-10-14 DIAGNOSIS — Z8551 Personal history of malignant neoplasm of bladder: Secondary | ICD-10-CM | POA: Diagnosis not present

## 2016-12-02 ENCOUNTER — Encounter: Payer: Self-pay | Admitting: Medical

## 2016-12-02 ENCOUNTER — Ambulatory Visit (INDEPENDENT_AMBULATORY_CARE_PROVIDER_SITE_OTHER): Payer: BLUE CROSS/BLUE SHIELD | Admitting: Medical

## 2016-12-02 VITALS — BP 133/81 | HR 79 | Temp 98.5°F | Resp 16 | Ht 74.0 in | Wt 224.6 lb

## 2016-12-02 DIAGNOSIS — Z8719 Personal history of other diseases of the digestive system: Secondary | ICD-10-CM

## 2016-12-02 DIAGNOSIS — M545 Low back pain: Secondary | ICD-10-CM | POA: Diagnosis not present

## 2016-12-02 DIAGNOSIS — R109 Unspecified abdominal pain: Secondary | ICD-10-CM | POA: Diagnosis not present

## 2016-12-02 DIAGNOSIS — M5431 Sciatica, right side: Secondary | ICD-10-CM | POA: Diagnosis not present

## 2016-12-02 MED ORDER — METRONIDAZOLE 500 MG PO TABS: 500.0000 mg | ORAL_TABLET | Freq: Three times a day (TID) | ORAL | 0 refills | Status: DC

## 2016-12-02 MED ORDER — TRIAMCINOLONE ACETONIDE 0.1 % EX CREA: 1.0000 "application " | TOPICAL_CREAM | Freq: Two times a day (BID) | CUTANEOUS | 1 refills | Status: DC

## 2016-12-02 MED ORDER — PREDNISONE 10 MG PO TABS: ORAL_TABLET | ORAL | 0 refills | Status: DC

## 2016-12-02 MED ORDER — AMMONIUM LACTATE 12 % EX CREA: TOPICAL_CREAM | CUTANEOUS | 1 refills | Status: DC | PRN

## 2016-12-02 MED ORDER — CIPROFLOXACIN HCL 500 MG PO TABS: 500.0000 mg | ORAL_TABLET | Freq: Two times a day (BID) | ORAL | 0 refills | Status: DC

## 2016-12-02 NOTE — Progress Notes (Signed)
Subjective:    Patient ID: Derrick Herrera, male    DOB: July 01, 1966, 51 y.o.   MRN: [id number]  HPI   Pt in for follow evaluation.  He states recently he had some lower back pain in rt si area for one week. Pt states in past he had some prednisone tablets for crohn's flare. Recent abdomen pain and rt si pain started around same time.  Pt states last week at time back pain started he had lower abdomen pain and cramping after eating strawberries. He started prednisone just yesterday 40 mg and he states abdomen and back feel better. He request remaining taper course No black or bloody stools requested.   Pt stats compared to yesterday his abdomen pain is much better. On review of colonscopy this past year showed view divericula but no findings of inflammation.  In 2010 colonoscopy showed normal terminal ileum and normal colon.        Review of Systems  Constitutional: Negative for chills, fatigue and fever.  HENT: Negative for congestion, ear discharge, ear pain and facial swelling.   Respiratory: Negative for cough, chest tightness, shortness of breath and wheezing.   Cardiovascular: Negative for chest pain and palpitations.  Gastrointestinal: Positive for abdominal pain. Negative for diarrhea, nausea and vomiting.       Earlier next week.  Genitourinary: Negative for decreased urine volume, discharge, dysuria, frequency, genital sores, penile pain, penile swelling and testicular pain.  Musculoskeletal: Positive for back pain. Negative for myalgias and neck stiffness.  Skin: Negative for rash.  Neurological: Negative for dizziness and headaches.  Hematological: Negative for adenopathy. Does not bruise/bleed easily.  Psychiatric/Behavioral: Negative for behavioral problems and confusion.    Past Medical History:  Diagnosis Date  . Bladder neoplasm   . Chronic back pain   . Crohn's disease ([place])    dx 1994--  followed by dr Havery Moros -- no current treatment  . Eczema   . GAD  (generalized anxiety disorder)   . History of colon polyps    benign 11/ 2017  . History of lower GI bleeding    1995  and 2003 (ileitis)  . 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 Wears glasses      Social History   Social History  . Marital status: Single    Spouse name: N/A  . Number of children: N/A  . Years of education: N/A   Occupational History  . Not on file.   Social History Main Topics  . Smoking status: Current Every Day Smoker    Packs/day: 0.75    Years: 10.00    Types: Cigarettes  . Smokeless tobacco: Never Used     Comment: pt trying to quit -- taking chantix and using nicoderm patch  . Alcohol use Yes     Comment: occasional  . Drug use: No  . Sexual activity: No   Other Topics Concern  . Not on file   Social History Narrative  . No narrative on file    Past Surgical History:  Procedure Laterality Date  . BRONCHOSCOPY  03/22/2008   dr Tarri Fuller young   w/ BX  . COLONOSCOPY  last one 05-10-2016  . LUMBAR DISC SURGERY  02/27/2016   L4-L5  . TRANSURETHRAL RESECTION OF BLADDER TUMOR WITH MITOMYCIN-C N/A 06/29/2016   Procedure: TRANSURETHRAL RESECTION OF BLADDER TUMOR WITH EPIRUBICIN;  Surgeon: Festus Aloe, MD;  Location: [facility];  Service: Urology;  Laterality: N/A;    Family History  Problem Relation Age of Onset  . Colon cancer Maternal Grandmother   . Diabetes Mother   . Esophageal cancer Neg Hx   . Stomach cancer Neg Hx     Allergies  Allergen Reactions  . Nsaids Other (See Comments)    Remote hx lower GI bleed    Current Outpatient Prescriptions on File Prior to Visit  Medication Sig Dispense Refill  . ammonium lactate (LAC-HYDRIN) 12 % cream Apply topically as needed for dry skin. 140 g 1  . clonazePAM (KLONOPIN) 0.5 MG tablet 1 tab po bid prn anxiety 30 tablet 0  . naloxegol oxalate (MOVANTIK) 25 MG TABS tablet Take 1  tablet (25 mg total) by mouth daily. 30 tablet 1  . nicotine (NICODERM CQ - DOSED IN MG/24 HOURS) 14 mg/24hr patch Place 1 patch (14 mg total) onto the skin daily. 28 patch 0  . oxyCODONE (OXYCONTIN) 10 mg 12 hr tablet Take 10 mg by mouth 2 (two) times daily as needed. Every 12 hours    . traZODone (DESYREL) 50 MG tablet Take 1 tablet (50 mg total) by mouth at bedtime. (Patient taking differently: Take 50 mg by mouth at bedtime as needed. ) 30 tablet 1  . triamcinolone cream (KENALOG) 0.1 % Apply 1 application topically 2 (two) times daily. 30 g 1   No current facility-administered medications on file prior to visit.     BP 133/81 (BP Location: Right Arm, Patient Position: Sitting, Cuff Size: Normal)   Pulse 79   Temp 98.5 F (36.9 C) (Oral)   Resp 16   Ht 6' 2"  (1.88 m)   Wt 224 lb 9.6 oz (101.9 kg)   SpO2 99%   BMI 28.84 kg/m      Objective:   Physical Exam  General Appearance- Not in acute distress.    Chest and Lung Exam Auscultation: Breath sounds:-Normal. Clear even and unlabored. Adventitious sounds:- No Adventitious sounds.  Cardiovascular Auscultation:Rythm - Regular, rate and rythm. Heart Sounds -Normal heart sounds.  Abdomen Inspection:-Inspection Normal.  Palpation/Perucssion: Palpation and Percussion of the abdomen reveal- mild-moderate Tenderness to palpation lower quadrants and periumbilical area, No Rebound tenderness, No rigidity(Guarding) and No Palpable abdominal masses.  Liver:-Normal.  Spleen:- Normal.   Back No miid lumbar spine tenderness to palpation. Rt si tenderness  Lower ext neurologic  L5-S1 sensation intact bilaterally. Normal patellar reflexes bilaterally. No foot drop bilaterally.      Assessment & Plan:  For recent abdomen pain and even some now will treat for diverticulitis with cipro and flagyl. Please get cbc today. If pain worsens would recommend ct abd/pelvis.  For residual sciatica will give 3 more days of tapered  prednisone.  Follow up in 7 days or as needed  Note I am not entirely convinced he had recent crohns flare. Rather with his pain and diverticula hx have concern for diverticultis.  Valarie Farace, Percell Miller, PA-C

## 2016-12-02 NOTE — Patient Instructions (Addendum)
For recent abdomen pain and even some now will treat for diverticulitis with cipro and flagyl. Please get cbc today. If pain worsens would recommend ct abd/pelvis. Any severe after hours pain then ED evaluation.  For residual sciatica will give 3 more days of tapered prednisone.  Follow up in 7 days or as needed

## 2016-12-03 ENCOUNTER — Telehealth: Payer: Self-pay | Admitting: Medical

## 2016-12-03 LAB — CBC WITH DIFFERENTIAL/PLATELET
BASOS PCT: 1.2 % (ref 0.0–3.0)
Basophils Absolute: 0.1 10*3/uL (ref 0.0–0.1)
Eosinophils Absolute: 0 10*3/uL (ref 0.0–0.7)
Eosinophils Relative: 0.4 % (ref 0.0–5.0)
HEMATOCRIT: 40.5 % (ref 39.0–52.0)
Hemoglobin: 13.5 g/dL (ref 13.0–17.0)
LYMPHS ABS: 1.2 10*3/uL (ref 0.7–4.0)
LYMPHS PCT: 10.6 % — AB (ref 12.0–46.0)
MCHC: 33.5 g/dL (ref 30.0–36.0)
MCV: 91.8 fl (ref 78.0–100.0)
MONOS PCT: 4.3 % (ref 3.0–12.0)
Monocytes Absolute: 0.5 10*3/uL (ref 0.1–1.0)
NEUTROS ABS: 9.8 10*3/uL — AB (ref 1.4–7.7)
Neutrophils Relative %: 83.5 % — ABNORMAL HIGH (ref 43.0–77.0)
PLATELETS: 256 10*3/uL (ref 150.0–400.0)
RBC: 4.41 Mil/uL (ref 4.22–5.81)
RDW: 13.2 % (ref 11.5–15.5)
WBC: 11.7 10*3/uL — ABNORMAL HIGH (ref 4.0–10.5)

## 2016-12-03 NOTE — Telephone Encounter (Signed)
Caller name:Joe Rider Relationship to patient: Can be reached: Pharmacy:[facility] [address]  Reason for call:[facility] did not receive predniSONE (DELTASONE) 10 MG tablet script. Can this be resent.

## 2016-12-03 NOTE — Telephone Encounter (Signed)
Called pharmacy  confirmed rx ready. Notified pt.

## 2016-12-06 ENCOUNTER — Telehealth: Payer: Self-pay | Admitting: Medical

## 2016-12-06 MED ORDER — PREDNISONE 10 MG PO TABS: 10.0000 mg | ORAL_TABLET | Freq: Every day | ORAL | 0 refills | Status: DC

## 2016-12-06 NOTE — Telephone Encounter (Signed)
Patient is asking for the taper of the prednisone says that usually helps.   Left pt a message to call back regarding lab results.

## 2016-12-06 NOTE — Telephone Encounter (Signed)
Pt states sciatica area flared over weekend. He found 50 mg of prednisone and pain much improved since taking today. He wants further rx. So I sent in 5 day taper prednisone. But also want him to continue the cipro and flagyl as explained on his last visits/avs. Pt expresses understanding.

## 2016-12-06 NOTE — Telephone Encounter (Signed)
Patient is also requesting results from blood work

## 2016-12-06 NOTE — Telephone Encounter (Signed)
Patient is asking for the taper of the prednisone says that usually helps.

## 2017-02-24 ENCOUNTER — Encounter: Payer: Self-pay | Admitting: Medical

## 2017-02-24 ENCOUNTER — Ambulatory Visit (HOSPITAL_BASED_OUTPATIENT_CLINIC_OR_DEPARTMENT_OTHER)
Admission: RE | Admit: 2017-02-24 | Discharge: 2017-02-24 | Disposition: A | Discharge status: Home / Self Care | Payer: BLUE CROSS/BLUE SHIELD | Source: Ambulatory Visit | Attending: Medical | Admitting: Medical

## 2017-02-24 ENCOUNTER — Ambulatory Visit (INDEPENDENT_AMBULATORY_CARE_PROVIDER_SITE_OTHER): Payer: BLUE CROSS/BLUE SHIELD | Admitting: Medical

## 2017-02-24 VITALS — BP 122/75 | HR 74 | Temp 97.8°F | Resp 16 | Ht 74.0 in | Wt 216.6 lb

## 2017-02-24 DIAGNOSIS — M48061 Spinal stenosis, lumbar region without neurogenic claudication: Secondary | ICD-10-CM | POA: Insufficient documentation

## 2017-02-24 DIAGNOSIS — M545 Low back pain: Secondary | ICD-10-CM | POA: Diagnosis not present

## 2017-02-24 DIAGNOSIS — M5431 Sciatica, right side: Secondary | ICD-10-CM | POA: Diagnosis not present

## 2017-02-24 DIAGNOSIS — M4317 Spondylolisthesis, lumbosacral region: Secondary | ICD-10-CM | POA: Insufficient documentation

## 2017-02-24 MED ORDER — PREDNISONE 10 MG PO TABS: ORAL_TABLET | ORAL | 0 refills | Status: DC

## 2017-02-24 MED ORDER — TIZANIDINE HCL 6 MG PO CAPS: 6.0000 mg | ORAL_CAPSULE | Freq: Three times a day (TID) | ORAL | 0 refills | Status: DC | PRN

## 2017-02-24 MED ORDER — OXYCODONE-ACETAMINOPHEN 5-325 MG PO TABS: 1.0000 | ORAL_TABLET | Freq: Four times a day (QID) | ORAL | 0 refills | Status: DC | PRN

## 2017-02-24 NOTE — Progress Notes (Signed)
Subjective:    Patient ID: Derrick Herrera, male    DOB: 11/18/1965, 51 y.o.   MRN: [id number]  HPI  Pt in for follow up.  Pt states she has low back pain that started yesterday.  Pt a year ago had back surgery. Pain was severe at the time of surgery. He has been doing well for about a year. He saw massage yesterday and he sees massage therapist monthly.   Then last night around 7 pm he just sat up from seated position and felt acute rt side lower back area. Pt pain is severe. Same as in past when had severe pain. He was desperate last night  with severe pain and took room mates percocet. He states made pain bearable so he would not cry.  Pt not reporting any shooting now  In his leg. No saddle anesthesia. No leg weakness.  He tried both robaxin and flexeril but did not help pain much.  He can find comfortable position. But with certain movements pain increases in rt  si area.   Review of Systems  Constitutional: Negative for chills and fatigue.  Respiratory: Negative for cough, chest tightness, shortness of breath and wheezing.   Cardiovascular: Negative for chest pain and palpitations.  Gastrointestinal: Negative for abdominal pain.  Genitourinary: Negative for dysuria, frequency and urgency.  Musculoskeletal: Positive for back pain. Negative for gait problem, myalgias and neck pain.  Skin: Negative for rash.  Neurological: Negative for dizziness, seizures, syncope, weakness, numbness and headaches.  Hematological: Negative for adenopathy. Does not bruise/bleed easily.  Psychiatric/Behavioral: Negative for behavioral problems, decreased concentration, dysphoric mood and hallucinations.    Past Medical History:  Diagnosis Date  . Bladder neoplasm   . Chronic back pain   . Crohn's disease ([place])    dx 1994--  followed by dr Havery Moros -- no current treatment  . Eczema   . GAD (generalized anxiety disorder)   . History of colon polyps    benign 11/ 2017  . History of lower  GI bleeding    1995  and 2003 (ileitis)  . Sarcoidosis of lung ([place]) followed by pt's pcp   dx 2009 via [facility] -- pulmologist-  dr Tarri Fuller young, lov 2010 (prn visit's)  . Wears glasses      Social History   Social History  . Marital status: Single    Spouse name: N/A  . Number of children: N/A  . Years of education: N/A   Occupational History  . Not on file.   Social History Main Topics  . Smoking status: Current Every Day Smoker    Packs/day: 0.75    Years: 10.00    Types: Cigarettes  . Smokeless tobacco: Never Used     Comment: pt trying to quit -- taking chantix and using nicoderm patch  . Alcohol use Yes     Comment: occasional  . Drug use: No  . Sexual activity: No   Other Topics Concern  . Not on file   Social History Narrative  . No narrative on file    Past Surgical History:  Procedure Laterality Date  . BRONCHOSCOPY  03/22/2008   dr Tarri Fuller young   w/ BX  . COLONOSCOPY  last one 05-10-2016  . LUMBAR DISC SURGERY  02/27/2016   L4-L5  . TRANSURETHRAL RESECTION OF BLADDER TUMOR WITH MITOMYCIN-C N/A 06/29/2016   Procedure: TRANSURETHRAL RESECTION OF BLADDER TUMOR WITH EPIRUBICIN;  Surgeon: Festus Aloe, MD;  Location: [facility];  Service: Urology;  Laterality: N/A;    Family History  Problem Relation Age of Onset  . Colon cancer Maternal Grandmother   . Diabetes Mother   . Esophageal cancer Neg Hx   . Stomach cancer Neg Hx     Allergies  Allergen Reactions  . Nsaids Other (See Comments)    Remote hx lower GI bleed    Current Outpatient Prescriptions on File Prior to Visit  Medication Sig Dispense Refill  . ammonium lactate (LAC-HYDRIN) 12 % cream Apply topically as needed for dry skin. 140 g 1  . clonazePAM (KLONOPIN) 0.5 MG tablet 1 tab po bid prn anxiety 30 tablet 0  . metroNIDAZOLE (FLAGYL) 500 MG tablet Take 1 tablet (500 mg total) by mouth 3 (three) times daily. 21 tablet 0  . naloxegol oxalate (MOVANTIK) 25 MG TABS  tablet Take 1 tablet (25 mg total) by mouth daily. 30 tablet 1  . nicotine (NICODERM CQ - DOSED IN MG/24 HOURS) 14 mg/24hr patch Place 1 patch (14 mg total) onto the skin daily. 28 patch 0  . oxyCODONE (OXYCONTIN) 10 mg 12 hr tablet Take 10 mg by mouth 2 (two) times daily as needed. Every 12 hours    . predniSONE (DELTASONE) 10 MG tablet Take 1 tablet (10 mg total) by mouth daily with breakfast. 15 tablet 0  . traZODone (DESYREL) 50 MG tablet Take 1 tablet (50 mg total) by mouth at bedtime. (Patient taking differently: Take 50 mg by mouth at bedtime as needed. ) 30 tablet 1  . triamcinolone cream (KENALOG) 0.1 % Apply 1 application topically 2 (two) times daily. 30 g 1   No current facility-administered medications on file prior to visit.     BP 122/75   Pulse 74   Temp 97.8 F (36.6 C) (Oral)   Resp 16   Ht 6' 2"  (1.88 m)   Wt 216 lb 9.6 oz (98.2 kg)   SpO2 100%   BMI 27.81 kg/m       Objective:   Physical Exam    General Appearance- Not in acute distress.    Chest and Lung Exam Auscultation: Breath sounds:-Normal. Clear even and unlabored. Adventitious sounds:- No Adventitious sounds.  Cardiovascular Auscultation:Rythm - Regular, rate and rythm. Heart Sounds -Normal heart sounds.  Abdomen Inspection:-Inspection Normal.  Palpation/Perucssion: Palpation and Percussion of the abdomen reveal- Non Tender, No Rebound tenderness, No rigidity(Guarding) and No Palpable abdominal masses.  Liver:-Normal.  Spleen:- Normal.   Back  No Mid lumbar spine tenderness to palpation. Rt si tenderness. Obvious severe pain on changing positions.   Pain on straight leg lift.  Pain on lateral movements and flexion/extension of the spine.   Lower ext neurologic  L5-S1 sensation intact bilaterally. Normal patellar reflexes bilaterally. No foot drop bilaterally.      Assessment & Plan:  For your severe back pain recently and your history of prior back surgery, I am prescribing  prednisone 5 day taper dose, Zanaflex muscle relaxant, and Roxicet pain medication.  Please go downstairs and get lumbar spine x-ray today.  We will follow you closely and see if your high level pain tapers down. If not based on your prior history will consider getting repeat MRI.   Red flag signs and symptoms reviewed today that would indicate need for emergency department evaluation.  Follow-up in 7-10 days or as needed.

## 2017-02-24 NOTE — Patient Instructions (Signed)
For your severe back pain recently and your history of prior back surgery, I am prescribing prednisone 5 day taper dose, Zanaflex muscle relaxant, and Roxicet pain medication.  Please go downstairs and get lumbar spine x-ray today.  We will follow you closely and see if your high level pain tapers down. If not based on your prior history will consider getting repeat MRI.   Red flag signs and symptoms reviewed today that would indicate need for emergency department evaluation.  Follow-up in 7-10 days or as needed.

## 2017-02-25 ENCOUNTER — Encounter: Payer: Self-pay | Admitting: Medical

## 2017-02-25 ENCOUNTER — Telehealth: Payer: Self-pay | Admitting: Medical

## 2017-02-25 ENCOUNTER — Ambulatory Visit (HOSPITAL_COMMUNITY): Payer: BLUE CROSS/BLUE SHIELD

## 2017-02-25 DIAGNOSIS — M5410 Radiculopathy, site unspecified: Secondary | ICD-10-CM

## 2017-02-25 DIAGNOSIS — M5440 Lumbago with sciatica, unspecified side: Secondary | ICD-10-CM

## 2017-02-25 MED ORDER — GABAPENTIN 100 MG PO CAPS: 100.0000 mg | ORAL_CAPSULE | Freq: Three times a day (TID) | ORAL | 0 refills | Status: DC

## 2017-02-25 NOTE — Telephone Encounter (Signed)
Patient states insurance is going to schedule

## 2017-02-25 NOTE — Telephone Encounter (Signed)
I talked with patient today and he is trying to get MRI scheduled at either [facility] or [facility]. Cough would be significantly less.

## 2017-02-25 NOTE — Telephone Encounter (Signed)
We are able to schedule patient's MRI at [facility]? Patient states that it is $1300 less there.Marland Kitchen

## 2017-02-25 NOTE — Telephone Encounter (Signed)
Patient is having done tonight at [facility] 5:45, states pain meds are not working, requesting something else. Please advise

## 2017-02-25 NOTE — Telephone Encounter (Signed)
Called pt and sent in gabapentin. Advised pharmacy that pt is taking medication not as prescribed more like 2 tab po every 6 hours. Explained I do think legitimate.   MRI pending. How to proceed in event he runs out over weekend. Called to ask since I don't know rules in scenario such as this.   But his pain level is legitimate. Discussed with his pharmacist.

## 2017-02-25 NOTE — Telephone Encounter (Signed)
Would you look at the MRI order I just placed. History of back surgery with recurrent recent severe pain shooting down to his leg. Cannot be scheduled for tomorrow. Let patient know if so.

## 2017-03-01 ENCOUNTER — Telehealth: Payer: Self-pay | Admitting: Emergency Medicine

## 2017-03-01 NOTE — Telephone Encounter (Signed)
Called [facility] for results for MRI ordered by Mackie Pai. LMOVM for records department for results of MRI.

## 2017-03-02 NOTE — Telephone Encounter (Signed)
If he feels like his pain is very minimal with normal activity then we could not get the MRI.  But if with activities of daily living he is in significant pain then I would advise going ahead and doing MRI. At least now we have an idea where to get the study economically and process will be quicker.

## 2017-03-02 NOTE — Telephone Encounter (Signed)
Spoke with [facility] and patient has not had the MRI completed yet.  Spoke with patient and he has not had the MRI. He was waiting on insurance to schedule the appointment. [employer] states [facility] will charge $700 for the MRI and [facility] $2500. He states a 3rd party is keeping up with cost of care. Patient states the Prednisone and Gabapentin is helping with pain. He has been resting/light duty. Is the MRI necessary since pain is improving. Please advise

## 2017-03-03 NOTE — Telephone Encounter (Signed)
Patina is not in the office, please send covering [place] at your office.

## 2017-03-03 NOTE — Telephone Encounter (Signed)
Please see the correspondence/nose regarding whether or not he should get an MRI. Would you mind reviewing that and calling the patient and seeing how he is doing.

## 2017-03-03 NOTE — Telephone Encounter (Signed)
Left pt a message to call back. 

## 2017-03-31 ENCOUNTER — Encounter: Payer: Self-pay | Admitting: Medical

## 2017-04-12 ENCOUNTER — Encounter: Payer: Self-pay | Admitting: Medical

## 2017-04-29 ENCOUNTER — Other Ambulatory Visit: Payer: Self-pay | Admitting: Medical

## 2017-04-29 ENCOUNTER — Telehealth: Payer: Self-pay | Admitting: Medical

## 2017-04-29 MED ORDER — TRAZODONE HCL 50 MG PO TABS: 50.0000 mg | ORAL_TABLET | Freq: Every evening | ORAL | 2 refills | Status: DC | PRN

## 2017-04-29 NOTE — Telephone Encounter (Signed)
Refill trazadone sent to pt pharmacy.

## 2017-04-29 NOTE — Telephone Encounter (Signed)
Pt requesting trazodone last rx 05/24/16. Please advise.

## 2017-09-06 ENCOUNTER — Other Ambulatory Visit: Payer: Self-pay | Admitting: Medical

## 2017-09-07 NOTE — Telephone Encounter (Signed)
Requesting:clonazepam Contract:none PTC:[id number] Last Visit:02/24/17 Next Visit:none Last Refill:06/24/16  Please Advise

## 2017-09-07 NOTE — Telephone Encounter (Signed)
Will you call patient and see if he can schedule appointment for Friday.  I have not seen him in a while and both the clonazepam and Lyrica are controlled medications.  So it would be best for him to come in.  Try to offer him early afternoon appointment close to 1 PM.

## 2017-09-13 ENCOUNTER — Other Ambulatory Visit: Payer: Self-pay | Admitting: Medical

## 2017-09-15 MED ORDER — TRAZODONE HCL 50 MG PO TABS: 50.0000 mg | ORAL_TABLET | Freq: Every evening | ORAL | 2 refills | Status: DC | PRN

## 2017-09-15 NOTE — Telephone Encounter (Signed)
Would you please let patient know that I did refill his trazodone.  But it has been sometime since I wrote him clonazepam.  Due to district laws on controlled medications I need him to make office visit for more clonazepam.  He does not have contract and the need to determine the appropriate amount of medication to give him.

## 2017-09-15 NOTE — Telephone Encounter (Signed)
I have sent patient my chart message to let him know he will need to come in for an office visit for any refills on clonazepam.

## 2017-09-15 NOTE — Telephone Encounter (Signed)
Requesting:clonazepam Contract:none  GXE:[id number] Last Visit:02/24/17 Next Visit:none Last Refill:06/24/16  Please Advise

## 2017-12-16 ENCOUNTER — Encounter: Payer: Self-pay | Admitting: Medical

## 2017-12-19 ENCOUNTER — Telehealth: Payer: Self-pay | Admitting: Medical

## 2017-12-19 MED ORDER — OXYCODONE-ACETAMINOPHEN 5-325 MG PO TABS: 1.0000 | ORAL_TABLET | Freq: Four times a day (QID) | ORAL | 0 refills | Status: DC | PRN

## 2017-12-19 NOTE — Telephone Encounter (Signed)
Sent percocet to his pharmacy. 4 day prescription. Shredded initial print rx. Hx of back surgery. Asking pt to come in for appointment. May need taper prednisone and or referral to former surgeon.

## 2018-01-19 DIAGNOSIS — N50819 Testicular pain, unspecified: Secondary | ICD-10-CM | POA: Diagnosis not present

## 2018-01-19 DIAGNOSIS — Z8551 Personal history of malignant neoplasm of bladder: Secondary | ICD-10-CM | POA: Diagnosis not present

## 2018-05-27 IMAGING — DX DG CHEST 2V
2 series · 2 of 2 positions shown · non-contrast
Comparison: Chest x-ray of February 23, 2016 and September 27, 2008

CLINICAL DATA: Cough and wheezing since low back surgery 1 week
ago. Patient is a current smoker.

EXAM:
CHEST  2 VIEW

[chest pa]
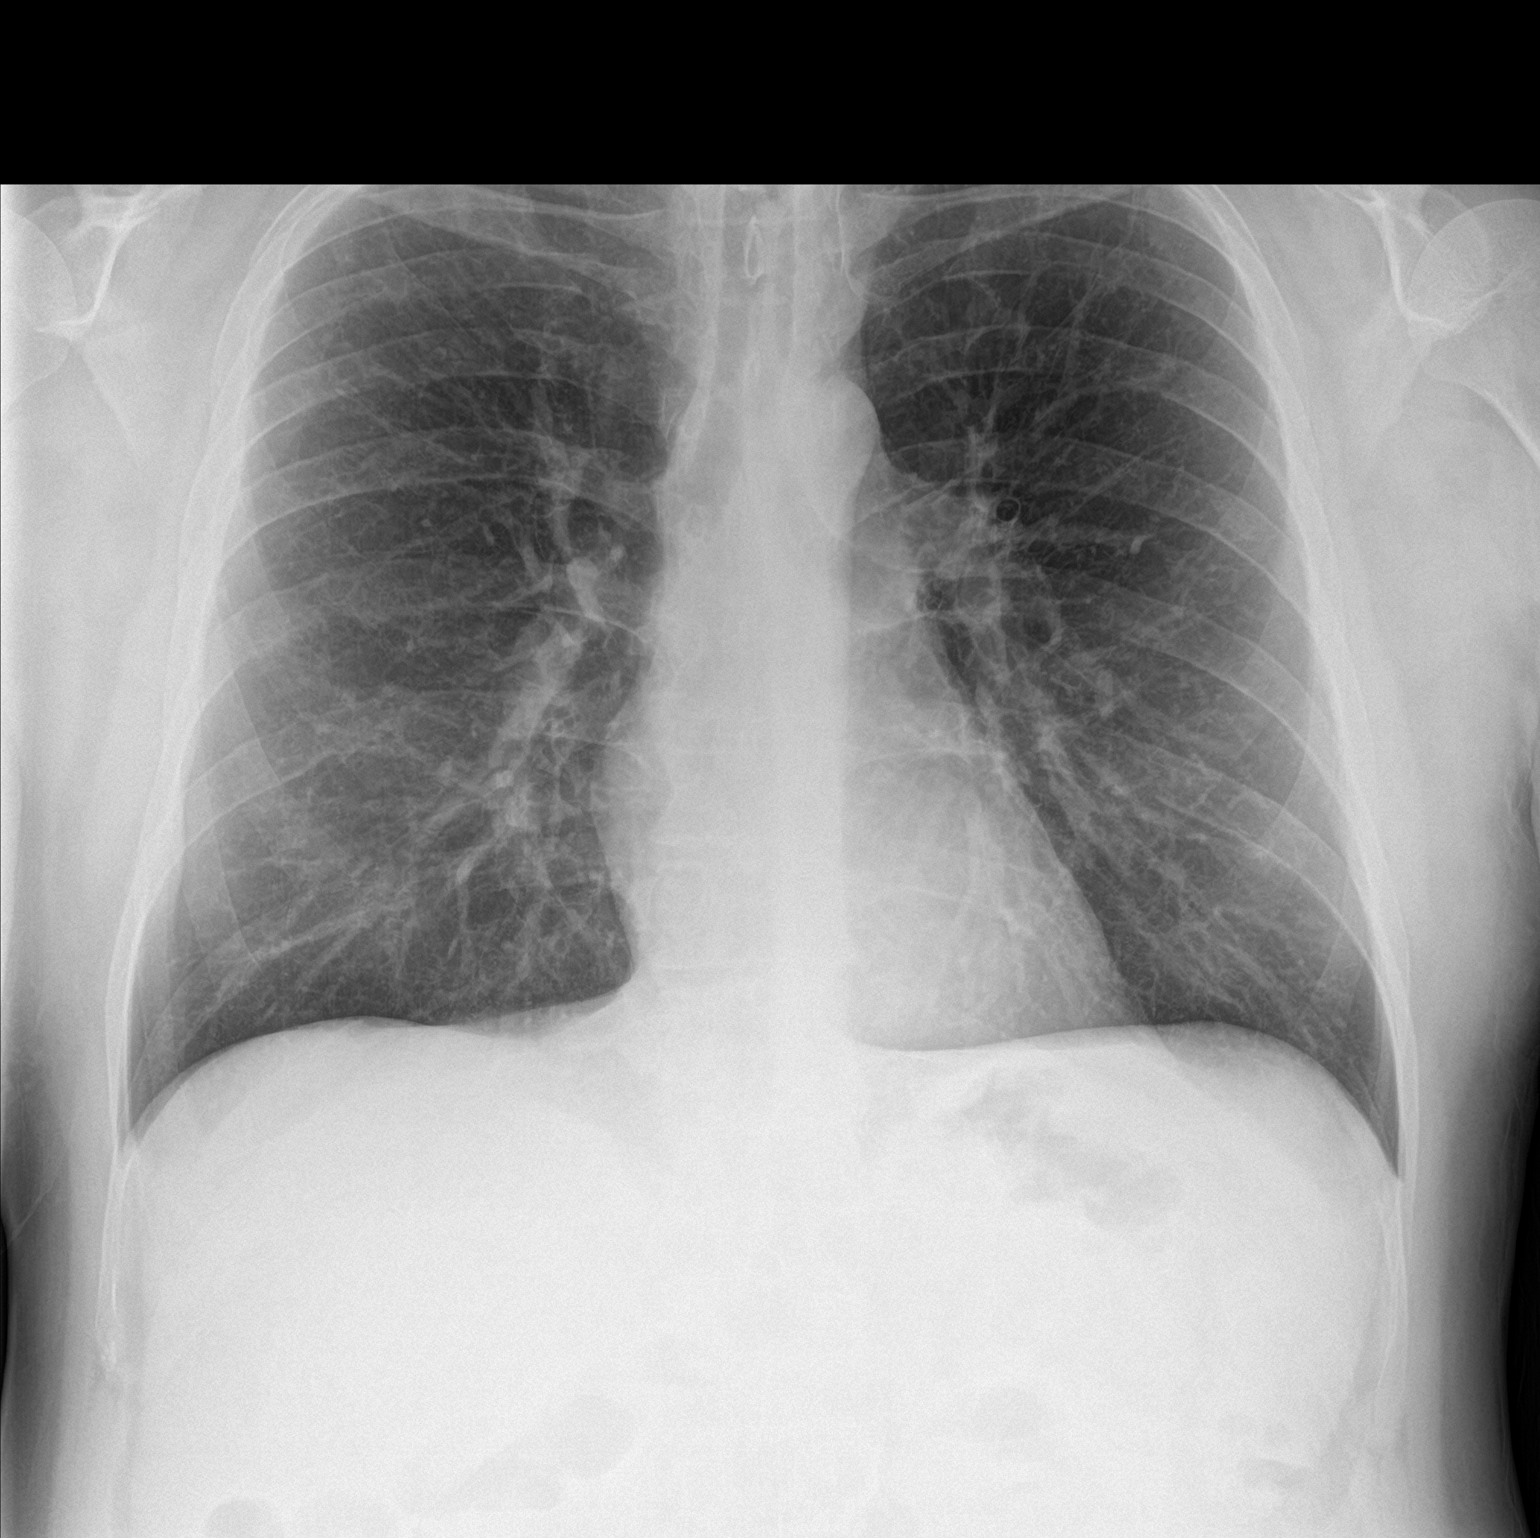

[chest lat]
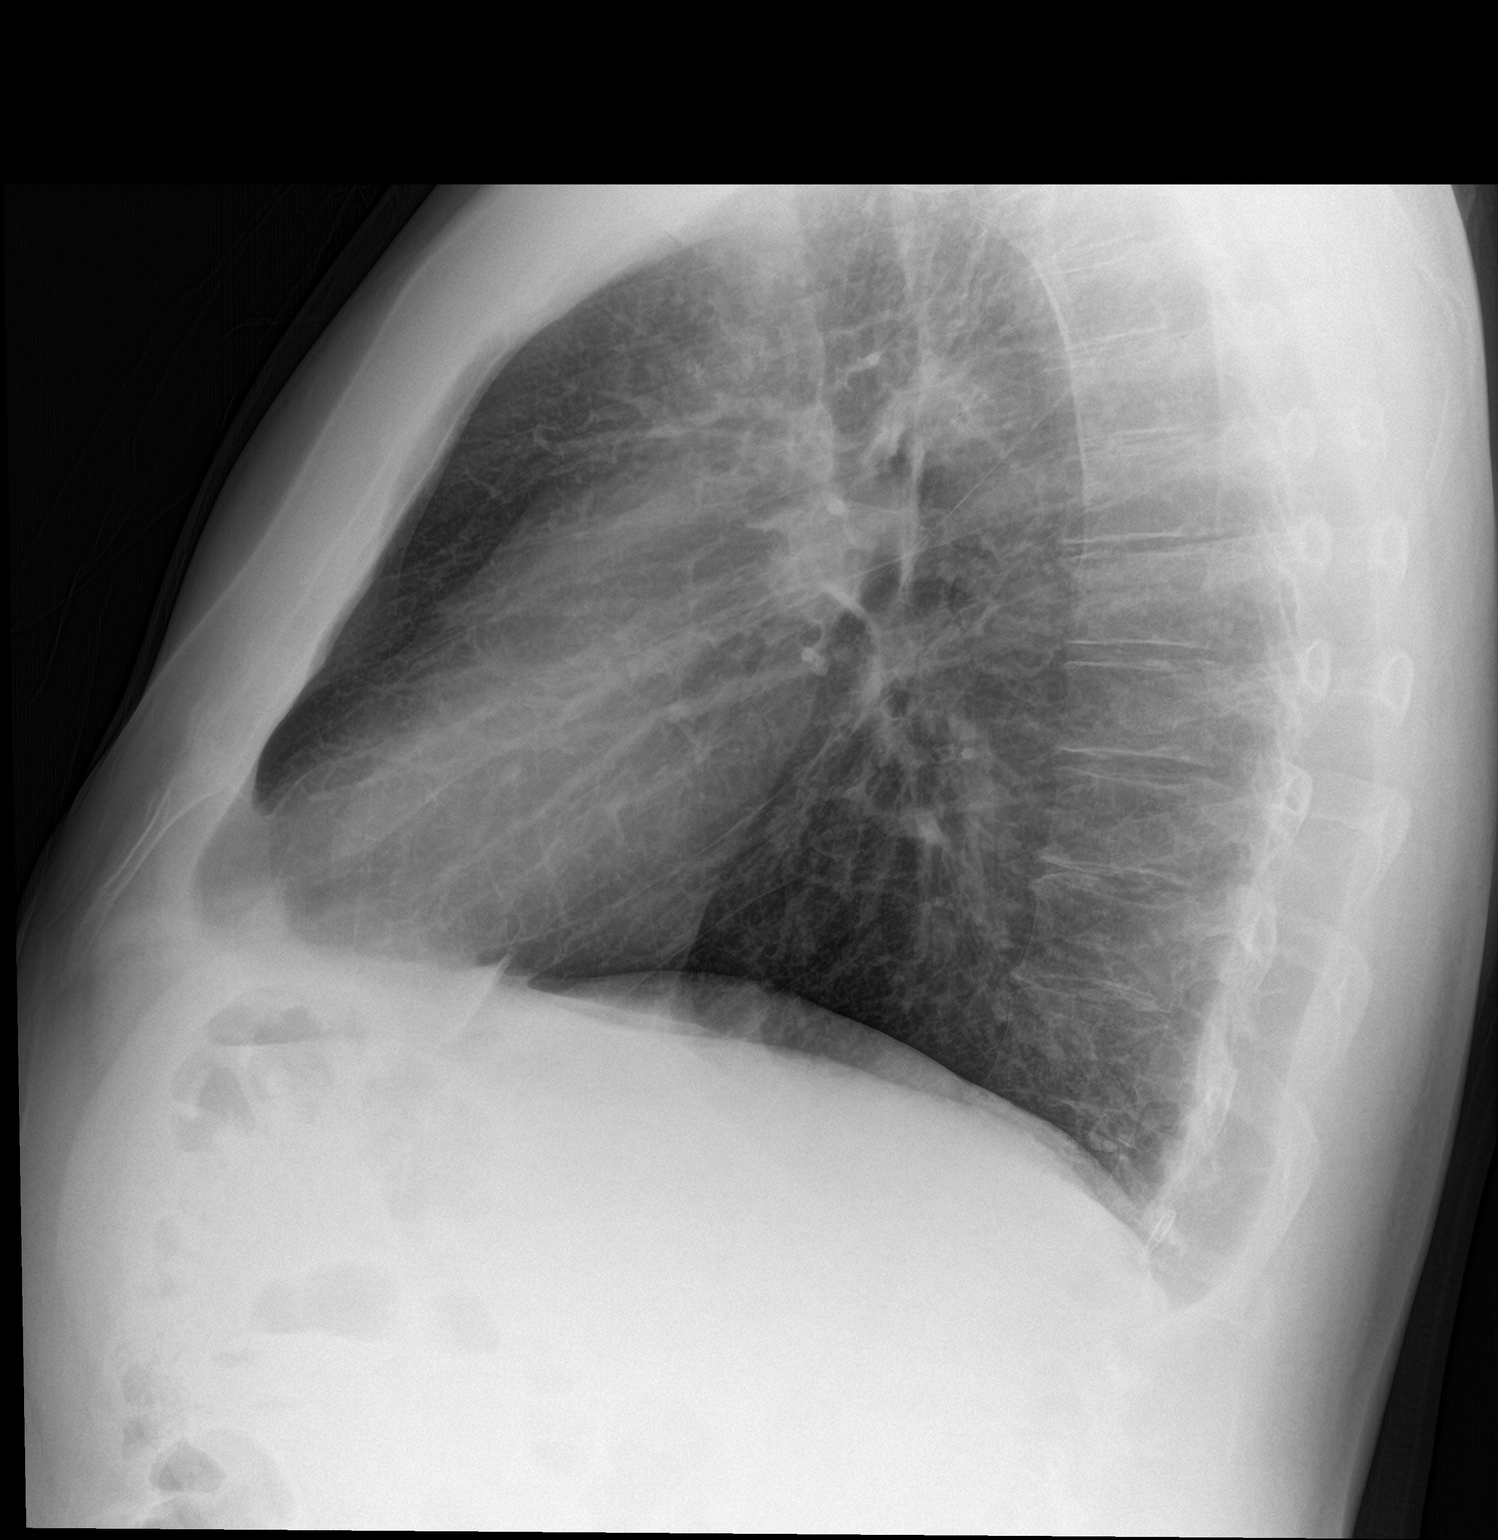

[2 of 2 positions shown; findings below may reference images not displayed]

FINDINGS: The lungs are adequately inflated. The interstitial markings are
coarse bilaterally but are stable. There is no alveolar infiltrate.
There is no pleural effusion or pneumothorax. The heart and
pulmonary vascularity are normal. The mediastinum is normal in
width. The bony thorax exhibits no acute abnormality.
IMPRESSION: Mild chronic bronchitic changes, stable. There is no pneumonia, CHF,
or other acute cardiopulmonary abnormality.

## 2018-08-06 IMAGING — MR MR PELVIS LIMTED W/O CM
2 series · 18 of 48 positions shown · IV contrast (multihance)
Comparison: None.

CLINICAL DATA: History of Crohn disease. Abdominal cramping and
diarrhea. Sarcoidosis.

EXAM:
MR PELVIS LIMITED WITHOUT CONTRAST; MRI ABDOMEN LIMITED WITHOUT
CONTRAST
TECHNIQUE: Multiplanar, multisequence MR imaging was performed following the
administration of intravenous contrast.
CONTRAST:  1 MULTIHANCE GADOBENATE DIMEGLUMINE 529 MG/ML IV SOLN

[Series 4: T2 · coronal · 5.0mm · 0.86mm/px · 9 of 41 slices shown (1 of 2)]
[im 1/41]
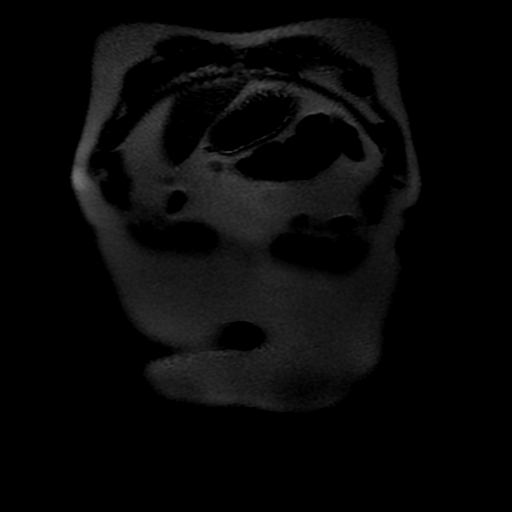
[im 6/41]
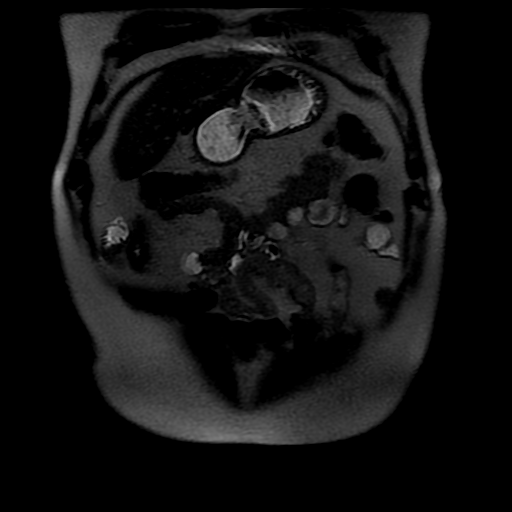
[im 12/41]
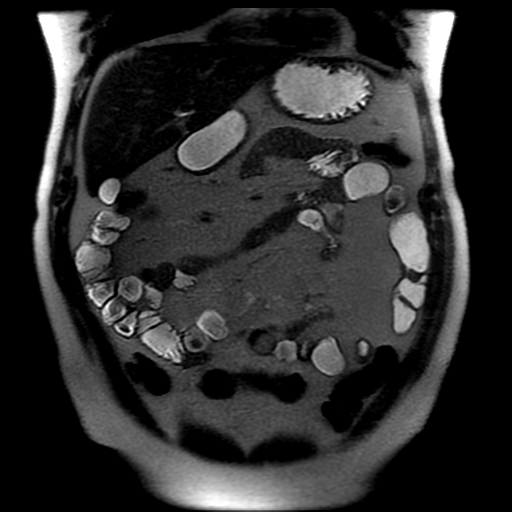
[im 18/41]
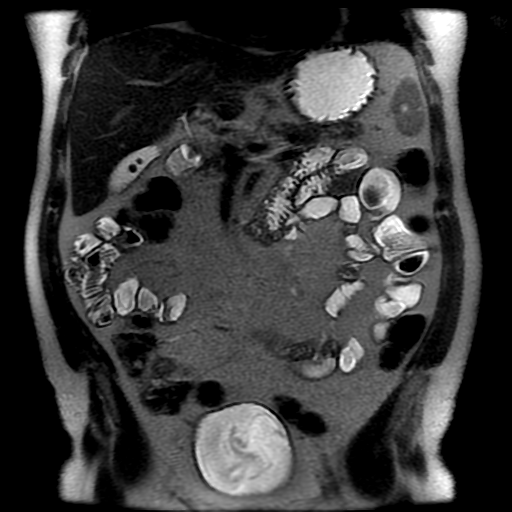
[im 21/41]
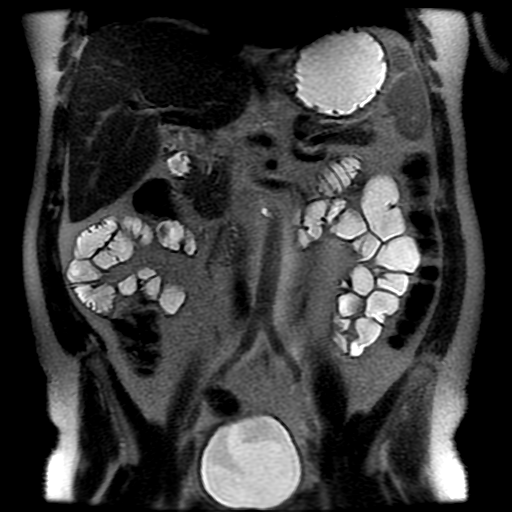
[im 23/41]
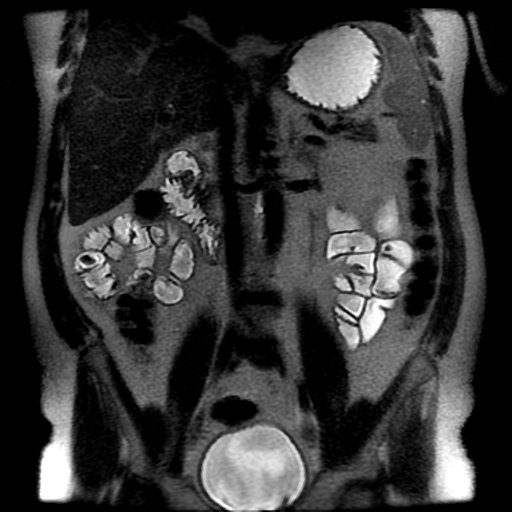
[im 29/41]
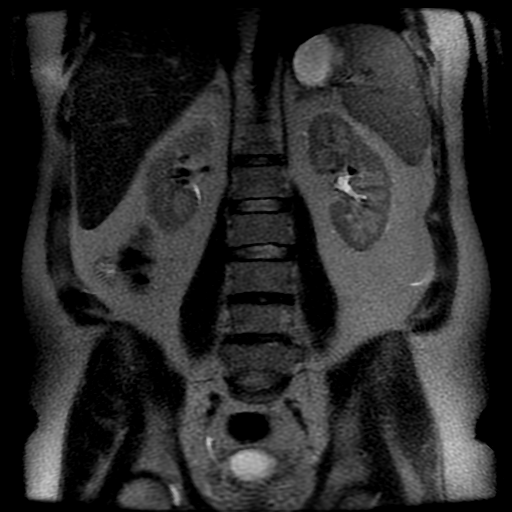
[im 35/41]
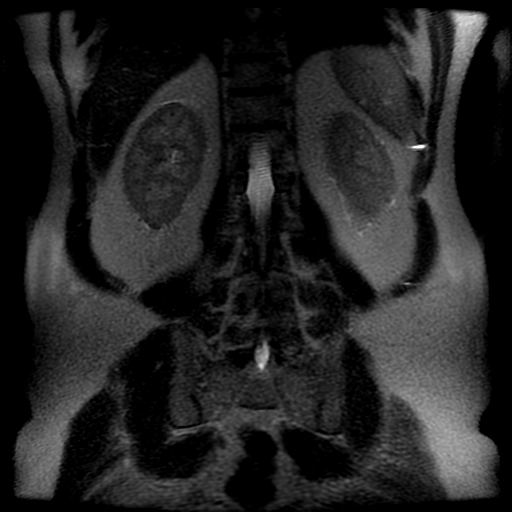
[im 41/41]
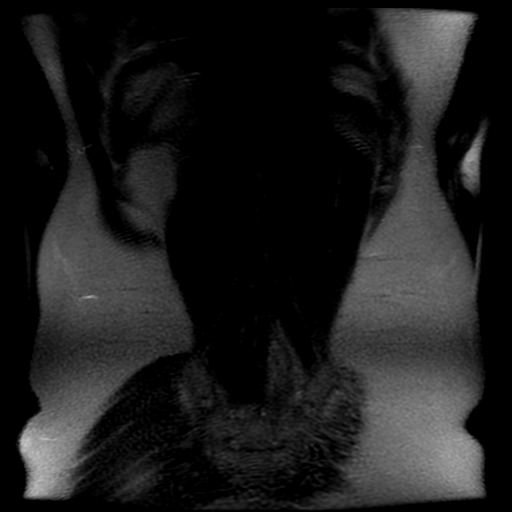

[Series 7: T2 · axial · 5.0mm · 0.86mm/px · z∈[-199,+151]mm · 9 of 90 slices shown (2 of 2)]
[im 6/90]
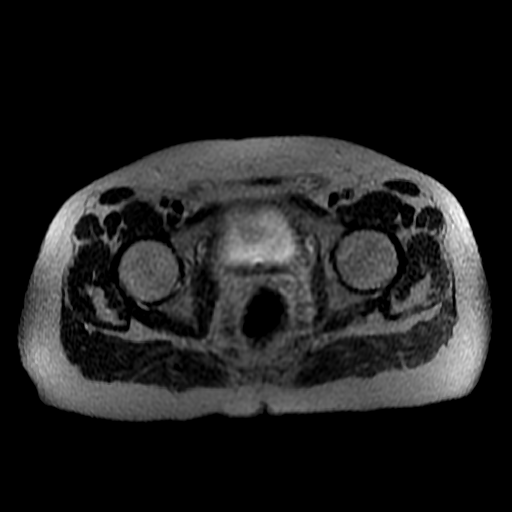
[im 14/90]
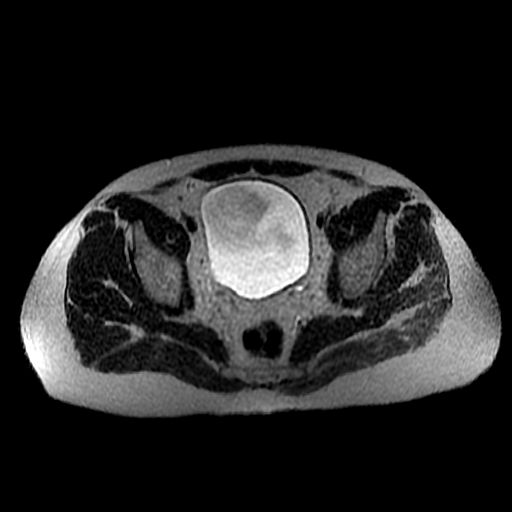
[im 17/90]
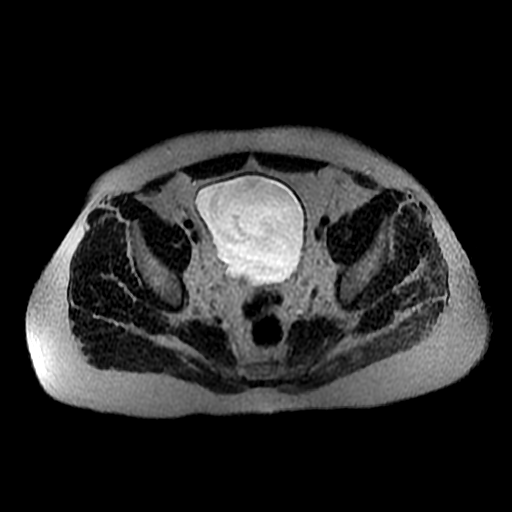
[im 28/90]
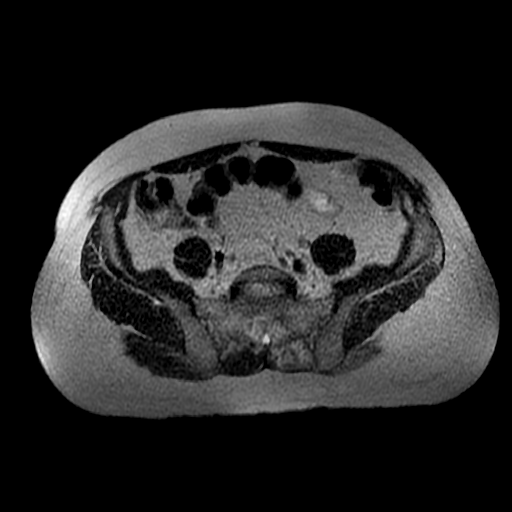
[im 39/90]
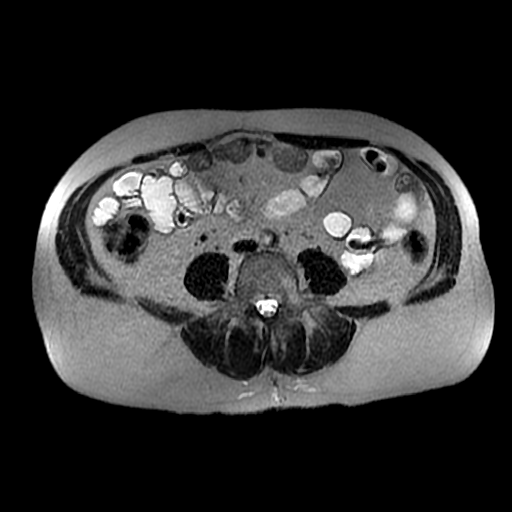
[im 45/90]
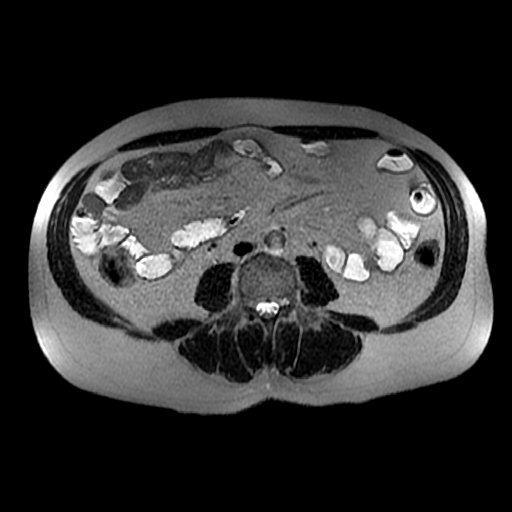
[im 51/90]
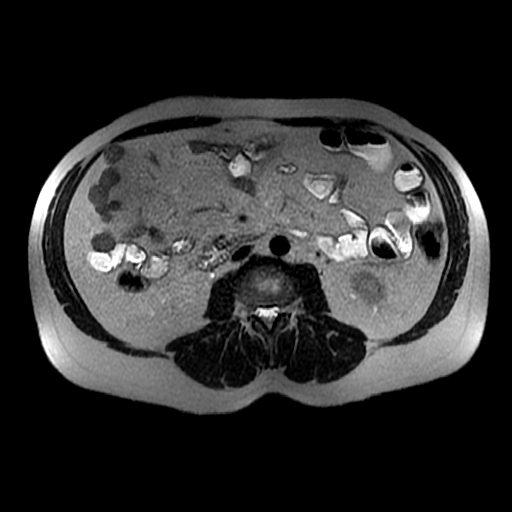
[im 62/90]
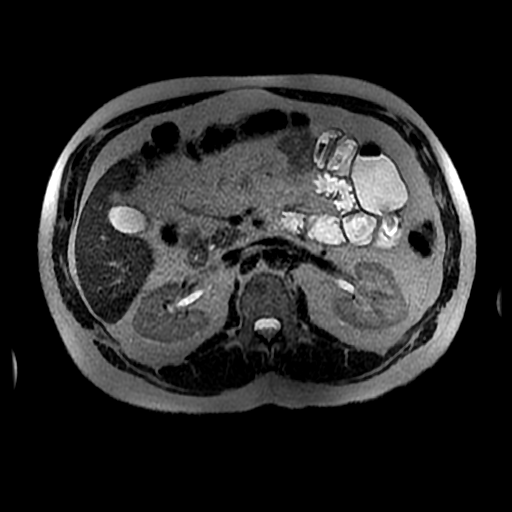
[im 76/90]
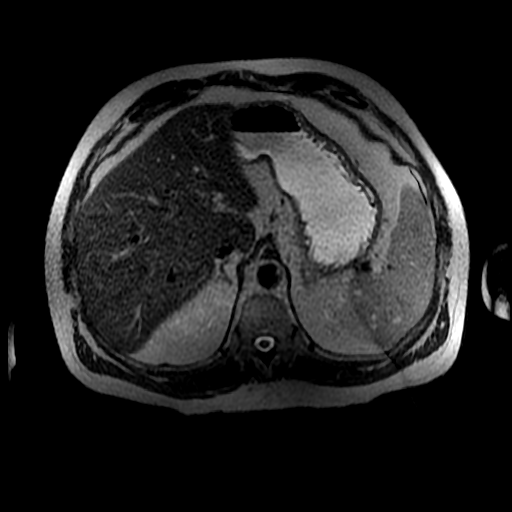

[18 of 48 positions shown; findings below may reference images not displayed]

FINDINGS: Of note, the patient had recent lower spine surgery and was unable
to tolerate supine positioning in the MRI scanner. The patient had
to stop the exam after acquisition of only the first 2 pulse
sequences. Reviewed the sequences at that time and given clinically
significant findings, the study was changed to a limited exam.

Lower chest:  Unremarkable

Hepatobiliary: Limited assessment shows no focal abnormality in the
liver parenchyma. Gallstones measure up to 8 mm. No intrahepatic or
extrahepatic biliary dilation.

Pancreas: No focal mass lesion. No dilatation of the main duct. No
intraparenchymal cyst. No peripancreatic edema.

Spleen: Multiple T2 hyperintensities are seen distributed in the
splenic parenchyma, compatible with the reported clinical history of
sarcoidosis.

Adrenals/Urinary Tract: Adrenal glands are unremarkable. No focal
abnormality or hydronephrosis is seen in either kidney. No evidence
for hydroureter. 1.6 x 1.7 x 1.8 cm sessile polypoid lesion
identified along the posterior right bladder wall.

Stomach/Bowel: Limited evaluation of the gastrointestinal tract
shows no evidence for wall thickening in the stomach or duodenum.
There is no small bowel dilatation. No small bowel wall thickening
is evident. No gross wall thickening in the terminal ileum and no
evidence for edema or focal fluid collection in the region of the
terminal ileum. Colon is not fluid-filled but shows no gross wall
thickening or pericolonic edema/ inflammation.

Vascular/Lymphatic: No abdominal aortic aneurysm. There is no
gastrohepatic or hepatoduodenal ligament lymphadenopathy. No
intraperitoneal or retroperitoneal lymphadenopathy. No pelvic
sidewall lymphadenopathy.

Reproductive: Prostate gland unremarkable.

Other: No intraperitoneal free fluid.

Musculoskeletal: No evidence for abnormal marrow signal within the
visualized bony anatomy.
IMPRESSION: 1. Markedly limited study in that only coronal and axial T2 weighted
sequences were obtained before the patient was unable to continue
with the exam. As such, full assessment for abnormal or differential
enhancement within the bowel wall is not possible. There is no gross
bowel wall thickening or bowel dilatation on today's study. No
features to suggest mesenteric/omental edema or intraperitoneal free
fluid.
2. 1.8 cm polypoid lesion associated with the right posterior
bladder wall. Imaging features are highly suspicious for urothelial
neoplasm.
3. Cholelithiasis.
4. Scattered T2 hyperintensities within the splenic parenchyma,
likely related to the patient's sarcoidosis.
Results were called by me at the time of interpretation on
05/25/2016 at [DATE] to Dr. NAZARETH JUMPER , who verbally
acknowledged these results.

## 2018-08-11 ENCOUNTER — Encounter: Payer: Self-pay | Admitting: Medical

## 2018-08-13 ENCOUNTER — Telehealth: Payer: Self-pay | Admitting: Medical

## 2018-08-13 MED ORDER — PREDNISONE 10 MG (21) PO TBPK: ORAL_TABLET | ORAL | 0 refills | Status: DC

## 2018-08-13 MED ORDER — PROMETHAZINE HCL 12.5 MG RE SUPP: 12.5000 mg | Freq: Four times a day (QID) | RECTAL | 0 refills | Status: DC | PRN

## 2018-08-13 MED ORDER — HYOSCYAMINE SULFATE 0.125 MG SL SUBL: 0.1250 mg | SUBLINGUAL_TABLET | Freq: Four times a day (QID) | SUBLINGUAL | 0 refills | Status: DC | PRN

## 2018-08-13 NOTE — Telephone Encounter (Signed)
Rx phenergan, levsin and prednisone sent to pt. See my chart message.

## 2018-09-28 ENCOUNTER — Ambulatory Visit: Payer: BLUE CROSS/BLUE SHIELD | Admitting: Medical

## 2018-10-12 ENCOUNTER — Encounter: Payer: Self-pay | Admitting: Medical

## 2018-11-01 ENCOUNTER — Telehealth: Payer: Self-pay | Admitting: Medical

## 2018-11-01 ENCOUNTER — Telehealth: Payer: Self-pay | Admitting: *Deleted

## 2018-11-01 DIAGNOSIS — Z113 Encounter for screening for infections with a predominantly sexual mode of transmission: Secondary | ICD-10-CM

## 2018-11-01 DIAGNOSIS — Z0000 Encounter for general adult medical examination without abnormal findings: Secondary | ICD-10-CM

## 2018-11-01 NOTE — Telephone Encounter (Signed)
Derrick Herrera -- I scheduled pt for a cpe with you on 01/16/19. He is asking to do labs the week prior so he can discuss them with you at the cpe. Can you place future orders for appropriate cpe labs and then I will contact pt to schedule lab appt prior to the cpe?

## 2018-11-01 NOTE — Telephone Encounter (Signed)
Labs placed for cpe. If you will go ahead and get him scheduled.

## 2018-11-02 NOTE — Telephone Encounter (Signed)
See secondary phone note from 11/01/18.

## 2018-11-02 NOTE — Addendum Note (Signed)
Addended by: Kelle Darting A on: 11/02/2018 10:24 AM   Modules accepted: Orders

## 2018-11-02 NOTE — Telephone Encounter (Signed)
Spoke with pt to schedule lab appt and he states he will go to [place] lab as it is closer to his home. Orders changed to reflect [place] lab.

## 2019-01-16 ENCOUNTER — Encounter: Payer: BLUE CROSS/BLUE SHIELD | Admitting: Medical

## 2019-05-24 ENCOUNTER — Encounter: Payer: Self-pay | Admitting: Medical

## 2019-05-28 ENCOUNTER — Encounter: Payer: Self-pay | Admitting: Medical

## 2019-05-28 ENCOUNTER — Ambulatory Visit (INDEPENDENT_AMBULATORY_CARE_PROVIDER_SITE_OTHER): Payer: BC Managed Care – PPO | Admitting: Medical

## 2019-05-28 ENCOUNTER — Other Ambulatory Visit: Payer: Self-pay

## 2019-05-28 VITALS — HR 85 | Wt 187.0 lb

## 2019-05-28 DIAGNOSIS — F419 Anxiety disorder, unspecified: Secondary | ICD-10-CM

## 2019-05-28 DIAGNOSIS — F329 Major depressive disorder, single episode, unspecified: Secondary | ICD-10-CM | POA: Diagnosis not present

## 2019-05-28 DIAGNOSIS — F32A Depression, unspecified: Secondary | ICD-10-CM

## 2019-05-28 MED ORDER — BUSPIRONE HCL 7.5 MG PO TABS: ORAL_TABLET | ORAL | 0 refills | Status: AC

## 2019-05-28 MED ORDER — BUPROPION HCL ER (XL) 150 MG PO TB24: 150.0000 mg | ORAL_TABLET | Freq: Every day | ORAL | 3 refills | Status: DC

## 2019-05-28 NOTE — Progress Notes (Signed)
   Subjective:    Patient ID: Derrick Herrera, male    DOB: 1965-10-25, 53 y.o.   MRN: [id number]  HPI  Virtual Visit via Video Note  I connected with Derrick Herrera on 05/28/19 at  2:20 PM EST by a video enabled telemedicine application and verified that I am speaking with the correct person using two identifiers.  Location: Patient: home Provider: office     I discussed the limitations of evaluation and management by telemedicine and the availability of in person appointments. The patient expressed understanding and agreed to proceed.  No blood pressure.  History of Present Illness:  Pt states his mood is worsening recently. 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 He has both depression ans anxiety. Worse anxiety than depression. With pandemic stress with not much hope in site regards to opening up. And he expresses others depend on him financially but he is under a lot of financial stress now.  A bit teary eyed briefly during conversation. No report of thoughts to harm self or others.     Observations/Objective: General-no acute distress, pleasant, oriented. Lungs- on inspection lungs appear unlabored. Neck- no tracheal deviation or jvd on inspection. Neuro- gross motor function appears intact.  Assessment and Plan: You do have recent anxiety and depression.  More anxiety per your report.  We will have you start BuSpar 7.5 mg tablets to take 1 to 2 tablets twice daily.  Would recommend starting out with low-dose 7.5 mg  and advance to 2 tablets twice daily if needed after 1 week.   Also restart Wellbutrin in 1 week.  You would mention possibly taking medication such as amitriptyline to help with mood and nerve pain.  Presently do not want to prescribe anything except for the above medication.  But might give low-dose amitriptyline as add-on on follow-up.  Follow-up in 2 weeks or as needed.  Mackie Pai, PA-C  Follow Up Instructions:    I  discussed the assessment and treatment plan with the patient. The patient was provided an opportunity to ask questions and all were answered. The patient agreed with the plan and demonstrated an understanding of the instructions.   The patient was advised to call back or seek an in-person evaluation if the symptoms worsen or if the condition fails to improve as anticipated.  I provided 25 minutes of non-face-to-face time during this encounter.   Mackie Pai, PA-C   Review of Systems  Constitutional: Negative for chills and fatigue.  HENT: Negative for congestion and ear pain.   Respiratory: Negative for cough, shortness of breath and wheezing.   Cardiovascular: Negative for chest pain and palpitations.  Gastrointestinal: Negative for abdominal pain.  Musculoskeletal: Negative for back pain.  Skin: Negative for rash.  Neurological: Negative for dizziness and light-headedness.  Hematological: Negative for adenopathy. Does not bruise/bleed easily.  Psychiatric/Behavioral: Positive for dysphoric mood. Negative for behavioral problems, confusion, sleep disturbance and suicidal ideas. The patient is nervous/anxious.        Objective:   Physical Exam        Assessment & Plan:

## 2019-05-28 NOTE — Patient Instructions (Signed)
You do have recent anxiety and depression.  More anxiety per your report.  We will have you start BuSpar 7.5 mg tablets to take 1 to 2 tablets twice daily.  Would recommend starting out with low-dose 7.5 mg  and advance to 2 tablets twice daily if needed after 1 week.   Also restart Wellbutrin in 1 week.  You would mention possibly taking medication such as amitriptyline to help with mood and nerve pain.  Presently do not want to prescribe anything except for the above medication.  But might give low-dose amitriptyline as add-on on follow-up.  Follow-up in 2 weeks or as needed.

## 2019-09-07 ENCOUNTER — Ambulatory Visit: Payer: BC Managed Care – PPO | Attending: Internal Medicine

## 2019-09-07 DIAGNOSIS — Z23 Encounter for immunization: Secondary | ICD-10-CM | POA: Insufficient documentation

## 2019-10-08 ENCOUNTER — Ambulatory Visit: Payer: BC Managed Care – PPO

## 2019-10-09 ENCOUNTER — Ambulatory Visit: Payer: BC Managed Care – PPO | Attending: Internal Medicine

## 2019-10-09 DIAGNOSIS — Z23 Encounter for immunization: Secondary | ICD-10-CM

## 2019-11-02 ENCOUNTER — Encounter: Payer: Self-pay | Admitting: Medical

## 2019-11-04 ENCOUNTER — Telehealth: Payer: Self-pay | Admitting: Medical

## 2019-11-04 MED ORDER — PREDNISONE 10 MG (21) PO TBPK: ORAL_TABLET | ORAL | 0 refills | Status: DC

## 2019-11-04 NOTE — Telephone Encounter (Signed)
Rx taper prednisone sent to pt pharmacy. Back pain flare.

## 2019-11-12 ENCOUNTER — Encounter: Payer: Self-pay | Admitting: Medical

## 2019-11-15 ENCOUNTER — Other Ambulatory Visit: Payer: Self-pay | Admitting: Medical

## 2019-11-19 ENCOUNTER — Encounter: Payer: Self-pay | Admitting: Medical

## 2019-11-21 ENCOUNTER — Encounter: Payer: Self-pay | Admitting: Medical

## 2019-11-21 ENCOUNTER — Ambulatory Visit: Payer: BC Managed Care – PPO | Admitting: Medical

## 2019-11-21 ENCOUNTER — Other Ambulatory Visit: Payer: Self-pay

## 2019-11-21 ENCOUNTER — Ambulatory Visit (HOSPITAL_BASED_OUTPATIENT_CLINIC_OR_DEPARTMENT_OTHER)
Admission: RE | Admit: 2019-11-21 | Discharge: 2019-11-21 | Disposition: A | Discharge status: Home / Self Care | Payer: BC Managed Care – PPO | Source: Ambulatory Visit | Attending: Medical | Admitting: Medical

## 2019-11-21 VITALS — BP 116/68 | HR 77 | Resp 18 | Ht 74.0 in | Wt 189.0 lb

## 2019-11-21 DIAGNOSIS — M545 Low back pain: Secondary | ICD-10-CM | POA: Diagnosis not present

## 2019-11-21 DIAGNOSIS — G8929 Other chronic pain: Secondary | ICD-10-CM

## 2019-11-21 DIAGNOSIS — M5442 Lumbago with sciatica, left side: Secondary | ICD-10-CM | POA: Insufficient documentation

## 2019-11-21 MED ORDER — HYDROCODONE-ACETAMINOPHEN 10-325 MG PO TABS: 1.0000 | ORAL_TABLET | Freq: Four times a day (QID) | ORAL | 0 refills | Status: AC | PRN

## 2019-11-21 MED ORDER — CYCLOBENZAPRINE HCL 10 MG PO TABS: 10.0000 mg | ORAL_TABLET | Freq: Every day | ORAL | 0 refills | Status: AC

## 2019-11-21 NOTE — Progress Notes (Signed)
Subjective:    Patient ID: Derrick Herrera, male    DOB: [birth year], 54 y.o.   MRN: [id number]  HPI   Pt in for follow up.   He has history of low back pain. Hx of surgery in past. Back pain did flare recently and I had called in prednisone. It did give him relief. Pt states prior to that he had 7 weeks of back pain. He also states prednisone helped alot.  Prior xray showed.   Since pain is to some degree that persist. He has pain radiate occasional to rt leg. This has been on off over past year.    IMPRESSION: 1. L5-S1 anterolisthesis and body shape suggests a pars defect, although not clearly visualized. 2. Mild L4-5 disc narrowing.  Hx of discectomy. Hx of bulging disc.    Review of Systems  Constitutional: Negative for chills, fatigue and fever.  Respiratory: Negative for chest tightness, shortness of breath and wheezing.   Cardiovascular: Negative for chest pain and palpitations.  Genitourinary: Negative for dysuria, flank pain and frequency.  Musculoskeletal: Positive for back pain.  Skin: Negative for rash.  Neurological: Negative for dizziness and light-headedness.  Hematological: Negative for adenopathy. Does not bruise/bleed easily.  Psychiatric/Behavioral: Negative for behavioral problems and confusion.   Past Medical History:  Diagnosis Date  . Bladder neoplasm   . Chronic back pain   . Crohn's disease ([place])    dx 1994--  followed by dr Havery Moros -- no current treatment  . Eczema   . GAD (generalized anxiety disorder)   . History of colon polyps    benign 11/ 2017  . History of lower GI bleeding    1995  and 2003 (ileitis)  . 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 Wears glasses      Social History   Socioeconomic History  . Marital status: Single    Spouse name: Not on file  . Number of children: Not on file  . Years of education: Not on file  . Highest  education level: Not on file  Occupational History  . Not on file  Tobacco Use  . Smoking status: Current Every Day Smoker    Packs/day: 0.75    Years: 10.00    Pack years: 7.50    Types: Cigarettes  . Smokeless tobacco: Never Used  . Tobacco comment: pt trying to quit -- taking chantix and using nicoderm patch  Substance and Sexual Activity  . Alcohol use: Yes    Comment: occasional  . Drug use: No  . Sexual activity: Never    Partners: Female    Birth control/protection: Condom  Other Topics Concern  . Not on file  Social History Narrative  . Not on file   Social Determinants of Health   Financial Resource Strain:   . Difficulty of Paying Living Expenses:   Food Insecurity:   . Worried About Charity fundraiser in the Last Year:   . Arboriculturist in the Last Year:   Transportation Needs:   . Film/video editor (Medical):   Marland Kitchen Lack of Transportation (Non-Medical):   Physical Activity:   . Days of Exercise per Week:   . Minutes of Exercise per Session:   Stress:   . Feeling of Stress :   Social Connections:   . Frequency of Communication with Friends and Family:   . Frequency  of Social Gatherings with Friends and Family:   . Attends Religious Services:   . Active Member of Clubs or Organizations:   . Attends Archivist Meetings:   Marland Kitchen Marital Status:   Intimate Partner Violence:   . Fear of Current or Ex-Partner:   . Emotionally Abused:   Marland Kitchen Physically Abused:   . Sexually Abused:     Past Surgical History:  Procedure Laterality Date  . BRONCHOSCOPY  03/22/2008   dr Tarri Fuller young   w/ BX  . COLONOSCOPY  last one 05-10-2016  . LUMBAR DISC SURGERY  02/27/2016   L4-L5  . TRANSURETHRAL RESECTION OF BLADDER TUMOR WITH MITOMYCIN-C N/A 06/29/2016   Procedure: TRANSURETHRAL RESECTION OF BLADDER TUMOR WITH EPIRUBICIN;  Surgeon: Festus Aloe, MD;  Location: [facility];  Service: Urology;  Laterality: N/A;    Family History  Problem  Relation Age of Onset  . Colon cancer Maternal Grandmother   . Diabetes Mother   . Esophageal cancer Neg Hx   . Stomach cancer Neg Hx     Allergies  Allergen Reactions  . Nsaids Other (See Comments)    Remote hx lower GI bleed    Current Outpatient Medications on File Prior to Visit  Medication Sig Dispense Refill  . buPROPion (WELLBUTRIN XL) 150 MG 24 hr tablet Take 1 tablet by mouth once daily 30 tablet 0  . busPIRone (BUSPAR) 7.5 MG tablet 1-2 tab po bid for anxiety 30 tablet 0  . predniSONE (STERAPRED UNI-PAK 21 TAB) 10 MG (21) TBPK tablet Taper over 6 days. 21 tablet 0   No current facility-administered medications on file prior to visit.    BP 116/68 (BP Location: Left Arm, Patient Position: Sitting, Cuff Size: Normal)   Pulse 77   Resp 18   Ht 6' 2"  (1.88 m)   Wt 189 lb (85.7 kg)   SpO2 97%   BMI 24.27 kg/m       Objective:   Physical Exam  General Mental Status- Alert. General Appearance- Not in acute distress.   Skin General: Color- Normal Color. Moisture- Normal Moisture.  Neck Carotid Arteries- Normal color. Moisture- Normal Moisture. No carotid bruits. No JVD.  Chest and Lung Exam Auscultation: Breath Sounds:-Normal.  Cardiovascular Auscultation:Rythm- Regular. Murmurs & Other Heart Sounds:Auscultation of the heart reveals- No Murmurs.  Abdomen Inspection:-Inspeection Normal. Palpation/Percussion:Note:No mass. Palpation and Percussion of the abdomen reveal- Non Tender, Non Distended + BS, no rebound or guarding.   Neurologic Cranial Nerve exam:- CN III-XII intact(No nystagmus), symmetric smile. Strength:- 5/5 equal and symmetric strength both upper and lower extremities.  Back- no back pain on palpation but sitting erect pain increases. Pain on straight leg lift.      Assessment & Plan:  For low back pain with radicular pain will  rx limited number of norco and flexeril muscle relaxant. Back stretching exercise.  Unfortunate can't use  nsaids due to gi history.  Xray of lumbar spine placed.  Referral back to neurosurgeon placed.  Follow up date to be determined.    Please schedule cpe/wellness sometime in summer.  Mackie Pai, PA-C  Time spent with patient today was  30 minutes which consisted of chart review, discussing diagnosis, work up,  treatment and documentation.

## 2019-11-21 NOTE — Patient Instructions (Addendum)
For low back pain with radicular pain will  rx limited number of norco and flexeril muscle relaxant. Back stretching exercise.  Unfortunate can't use nsaids due to gi history.  Xray of lumbar spine placed.  Referral back to neurosurgeon placed.  Follow up date to be determined.    Please schedule cpe/wellness sometime in summer.

## 2019-11-27 ENCOUNTER — Encounter: Payer: Self-pay | Admitting: Medical

## 2019-12-14 DIAGNOSIS — M549 Dorsalgia, unspecified: Secondary | ICD-10-CM | POA: Diagnosis not present

## 2019-12-14 DIAGNOSIS — M4316 Spondylolisthesis, lumbar region: Secondary | ICD-10-CM | POA: Diagnosis not present

## 2019-12-14 DIAGNOSIS — G8929 Other chronic pain: Secondary | ICD-10-CM | POA: Diagnosis not present

## 2019-12-14 DIAGNOSIS — M5416 Radiculopathy, lumbar region: Secondary | ICD-10-CM | POA: Diagnosis not present

## 2019-12-28 ENCOUNTER — Other Ambulatory Visit: Payer: Self-pay | Admitting: Neurological Surgery

## 2019-12-28 DIAGNOSIS — M4316 Spondylolisthesis, lumbar region: Secondary | ICD-10-CM

## 2019-12-29 ENCOUNTER — Other Ambulatory Visit: Payer: Self-pay | Admitting: Neurological Surgery

## 2019-12-29 ENCOUNTER — Ambulatory Visit
Admission: RE | Admit: 2019-12-29 | Discharge: 2019-12-29 | Disposition: A | Discharge status: Home / Self Care | Payer: BC Managed Care – PPO | Source: Ambulatory Visit | Attending: Neurological Surgery | Admitting: Neurological Surgery

## 2019-12-29 ENCOUNTER — Other Ambulatory Visit: Payer: Self-pay

## 2019-12-29 DIAGNOSIS — M4316 Spondylolisthesis, lumbar region: Secondary | ICD-10-CM

## 2019-12-29 DIAGNOSIS — M545 Low back pain: Secondary | ICD-10-CM | POA: Diagnosis not present

## 2020-01-04 ENCOUNTER — Other Ambulatory Visit: Payer: Self-pay | Admitting: Medical

## 2020-01-04 ENCOUNTER — Telehealth: Payer: Self-pay | Admitting: Medical

## 2020-01-04 ENCOUNTER — Encounter: Payer: Self-pay | Admitting: Medical

## 2020-01-04 MED ORDER — BUPROPION HCL ER (XL) 150 MG PO TB24: 150.0000 mg | ORAL_TABLET | Freq: Every day | ORAL | 3 refills | Status: DC

## 2020-01-04 MED ORDER — HYDROCODONE-ACETAMINOPHEN 10-325 MG PO TABS: 1.0000 | ORAL_TABLET | Freq: Four times a day (QID) | ORAL | 0 refills | Status: AC | PRN

## 2020-01-04 NOTE — Telephone Encounter (Signed)
Rx norco refill sent to pt pharamcy.

## 2020-01-04 NOTE — Telephone Encounter (Signed)
Patient stated he sent sagiuer a mychart message this morning regarding his medication refill. Patient states hes freaking out because, its a holiday weekend , and doesn't want to be with out meds , please advise

## 2020-01-04 NOTE — Telephone Encounter (Signed)
See previous mychart encounter message.

## 2020-01-04 NOTE — Telephone Encounter (Signed)
Rx sent to pt pharmacy 

## 2020-01-16 DIAGNOSIS — M5416 Radiculopathy, lumbar region: Secondary | ICD-10-CM | POA: Diagnosis not present

## 2020-01-16 DIAGNOSIS — M4316 Spondylolisthesis, lumbar region: Secondary | ICD-10-CM | POA: Diagnosis not present

## 2020-01-17 DIAGNOSIS — M4316 Spondylolisthesis, lumbar region: Secondary | ICD-10-CM | POA: Diagnosis not present

## 2020-01-17 DIAGNOSIS — M5116 Intervertebral disc disorders with radiculopathy, lumbar region: Secondary | ICD-10-CM | POA: Diagnosis not present

## 2020-01-17 DIAGNOSIS — M5416 Radiculopathy, lumbar region: Secondary | ICD-10-CM | POA: Diagnosis not present

## 2020-02-15 DIAGNOSIS — M4316 Spondylolisthesis, lumbar region: Secondary | ICD-10-CM | POA: Diagnosis not present

## 2020-05-06 ENCOUNTER — Ambulatory Visit: Payer: BC Managed Care – PPO | Attending: Internal Medicine

## 2020-05-06 ENCOUNTER — Other Ambulatory Visit (HOSPITAL_BASED_OUTPATIENT_CLINIC_OR_DEPARTMENT_OTHER): Payer: Self-pay | Admitting: Internal Medicine

## 2020-05-06 DIAGNOSIS — Z23 Encounter for immunization: Secondary | ICD-10-CM

## 2020-05-06 MED FILL — FLUARIX QUADRIVALENT 0.5 ML: 0.5 | 1 days supply | Qty: 1 | Fill #0

## 2020-05-06 NOTE — Progress Notes (Signed)
   Covid-19 Vaccination Clinic  Name:  Derrick Herrera    MRN: [id number] DOB: [birth year]  05/06/2020  Mr. [name] was observed post Covid-19 immunization for 15 minutes without incident. He was provided with Vaccine Information Sheet and instruction to access the V-Safe system.   Mr. [name] was instructed to call 911 with any severe reactions post vaccine: Marland Kitchen Difficulty breathing  . Swelling of face and throat  . A fast heartbeat  . A bad rash all over body  . Dizziness and weakness

## 2020-05-15 MED FILL — PFIZER-BIONTECH COVID-19 VA: 30 | 1 days supply | Qty: 0 | Fill #0

## 2020-06-20 ENCOUNTER — Other Ambulatory Visit (HOSPITAL_BASED_OUTPATIENT_CLINIC_OR_DEPARTMENT_OTHER): Payer: Self-pay | Admitting: Internal Medicine

## 2020-06-23 MED FILL — PFIZER-BIONTECH COVID-19 VA: 30 | 1 days supply | Qty: 0 | Fill #0

## 2020-08-28 DIAGNOSIS — M5116 Intervertebral disc disorders with radiculopathy, lumbar region: Secondary | ICD-10-CM | POA: Diagnosis not present

## 2020-08-28 DIAGNOSIS — M5416 Radiculopathy, lumbar region: Secondary | ICD-10-CM | POA: Diagnosis not present

## 2020-10-29 ENCOUNTER — Ambulatory Visit (INDEPENDENT_AMBULATORY_CARE_PROVIDER_SITE_OTHER): Payer: BC Managed Care – PPO | Admitting: Physician Assistant

## 2020-10-29 ENCOUNTER — Encounter: Payer: Self-pay | Admitting: Physician Assistant

## 2020-10-29 VITALS — BP 152/102 | HR 106 | Ht 73.0 in | Wt 200.0 lb

## 2020-10-29 DIAGNOSIS — F411 Generalized anxiety disorder: Secondary | ICD-10-CM | POA: Diagnosis not present

## 2020-10-29 DIAGNOSIS — F331 Major depressive disorder, recurrent, moderate: Secondary | ICD-10-CM | POA: Diagnosis not present

## 2020-10-29 MED ORDER — DULOXETINE HCL 60 MG PO CPEP: 60.0000 mg | ORAL_CAPSULE | Freq: Every day | ORAL | 1 refills | Status: AC

## 2020-10-29 MED ORDER — DULOXETINE HCL 30 MG PO CPEP: 30.0000 mg | ORAL_CAPSULE | Freq: Every day | ORAL | 0 refills | Status: AC

## 2020-10-29 MED ORDER — HYDROXYZINE HCL 25 MG PO TABS: 12.5000 mg | ORAL_TABLET | Freq: Three times a day (TID) | ORAL | 0 refills | Status: AC | PRN
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
## 2020-10-30 ENCOUNTER — Encounter: Payer: Self-pay | Admitting: Physician Assistant

## 2020-10-30 DIAGNOSIS — F331 Major depressive disorder, recurrent, moderate: Secondary | ICD-10-CM | POA: Insufficient documentation

## 2020-10-30 DIAGNOSIS — F411 Generalized anxiety disorder: Secondary | ICD-10-CM | POA: Insufficient documentation

## 2020-11-06 DIAGNOSIS — L239 Allergic contact dermatitis, unspecified cause: Secondary | ICD-10-CM | POA: Diagnosis not present

## 2020-11-28 DIAGNOSIS — Z20822 Contact with and (suspected) exposure to covid-19: Secondary | ICD-10-CM | POA: Diagnosis not present

## 2020-12-17 DIAGNOSIS — L239 Allergic contact dermatitis, unspecified cause: Secondary | ICD-10-CM | POA: Diagnosis not present

## 2021-05-25 DIAGNOSIS — F1120 Opioid dependence, uncomplicated: Secondary | ICD-10-CM | POA: Diagnosis not present

## 2021-05-25 DIAGNOSIS — Z7151 Drug abuse counseling and surveillance of drug abuser: Secondary | ICD-10-CM | POA: Diagnosis not present

## 2021-05-26 DIAGNOSIS — F1120 Opioid dependence, uncomplicated: Secondary | ICD-10-CM | POA: Diagnosis not present

## 2021-05-26 DIAGNOSIS — Z03818 Encounter for observation for suspected exposure to other biological agents ruled out: Secondary | ICD-10-CM | POA: Diagnosis not present

## 2021-06-01 DIAGNOSIS — F1120 Opioid dependence, uncomplicated: Secondary | ICD-10-CM | POA: Diagnosis not present

## 2021-06-01 DIAGNOSIS — Z7151 Drug abuse counseling and surveillance of drug abuser: Secondary | ICD-10-CM | POA: Diagnosis not present

## 2021-06-04 DIAGNOSIS — Z7151 Drug abuse counseling and surveillance of drug abuser: Secondary | ICD-10-CM | POA: Diagnosis not present

## 2021-06-04 DIAGNOSIS — F1120 Opioid dependence, uncomplicated: Secondary | ICD-10-CM | POA: Diagnosis not present

## 2021-06-13 ENCOUNTER — Emergency Department (HOSPITAL_COMMUNITY): Payer: BC Managed Care – PPO

## 2021-06-13 ENCOUNTER — Encounter (HOSPITAL_COMMUNITY): Payer: Self-pay

## 2021-06-13 ENCOUNTER — Inpatient Hospital Stay (HOSPITAL_COMMUNITY)
Admission: EM | Admit: 2021-06-13 | Discharge: 2021-06-15 | DRG: 194 | Disposition: A | Discharge status: Home / Self Care | Payer: BC Managed Care – PPO | Attending: Internal Medicine | Admitting: Internal Medicine

## 2021-06-13 DIAGNOSIS — J189 Pneumonia, unspecified organism: Secondary | ICD-10-CM | POA: Diagnosis not present

## 2021-06-13 DIAGNOSIS — D860 Sarcoidosis of lung: Secondary | ICD-10-CM | POA: Diagnosis not present

## 2021-06-13 DIAGNOSIS — R918 Other nonspecific abnormal finding of lung field: Secondary | ICD-10-CM | POA: Diagnosis present

## 2021-06-13 DIAGNOSIS — Z79899 Other long term (current) drug therapy: Secondary | ICD-10-CM | POA: Diagnosis not present

## 2021-06-13 DIAGNOSIS — K5090 Crohn's disease, unspecified, without complications: Secondary | ICD-10-CM | POA: Diagnosis not present

## 2021-06-13 DIAGNOSIS — F32A Depression, unspecified: Secondary | ICD-10-CM | POA: Diagnosis not present

## 2021-06-13 DIAGNOSIS — R000 Tachycardia, unspecified: Secondary | ICD-10-CM | POA: Diagnosis present

## 2021-06-13 DIAGNOSIS — E876 Hypokalemia: Secondary | ICD-10-CM | POA: Diagnosis not present

## 2021-06-13 DIAGNOSIS — Z818 Family history of other mental and behavioral disorders: Secondary | ICD-10-CM

## 2021-06-13 DIAGNOSIS — I471 Supraventricular tachycardia: Secondary | ICD-10-CM | POA: Diagnosis not present

## 2021-06-13 DIAGNOSIS — Z886 Allergy status to analgesic agent status: Secondary | ICD-10-CM

## 2021-06-13 DIAGNOSIS — I2510 Atherosclerotic heart disease of native coronary artery without angina pectoris: Secondary | ICD-10-CM | POA: Diagnosis not present

## 2021-06-13 DIAGNOSIS — R079 Chest pain, unspecified: Secondary | ICD-10-CM | POA: Diagnosis not present

## 2021-06-13 DIAGNOSIS — R0902 Hypoxemia: Secondary | ICD-10-CM

## 2021-06-13 DIAGNOSIS — R0600 Dyspnea, unspecified: Secondary | ICD-10-CM | POA: Diagnosis not present

## 2021-06-13 DIAGNOSIS — Z20822 Contact with and (suspected) exposure to covid-19: Secondary | ICD-10-CM | POA: Diagnosis not present

## 2021-06-13 DIAGNOSIS — F17210 Nicotine dependence, cigarettes, uncomplicated: Secondary | ICD-10-CM | POA: Diagnosis present

## 2021-06-13 DIAGNOSIS — F418 Other specified anxiety disorders: Secondary | ICD-10-CM | POA: Diagnosis present

## 2021-06-13 DIAGNOSIS — K449 Diaphragmatic hernia without obstruction or gangrene: Secondary | ICD-10-CM | POA: Diagnosis not present

## 2021-06-13 DIAGNOSIS — R0602 Shortness of breath: Secondary | ICD-10-CM | POA: Diagnosis not present

## 2021-06-13 DIAGNOSIS — G8929 Other chronic pain: Secondary | ICD-10-CM | POA: Diagnosis not present

## 2021-06-13 DIAGNOSIS — F411 Generalized anxiety disorder: Secondary | ICD-10-CM | POA: Diagnosis not present

## 2021-06-13 DIAGNOSIS — R9431 Abnormal electrocardiogram [ECG] [EKG]: Secondary | ICD-10-CM | POA: Diagnosis not present

## 2021-06-13 DIAGNOSIS — I480 Paroxysmal atrial fibrillation: Secondary | ICD-10-CM

## 2021-06-13 DIAGNOSIS — R911 Solitary pulmonary nodule: Secondary | ICD-10-CM | POA: Diagnosis present

## 2021-06-13 LAB — CBC WITH DIFFERENTIAL/PLATELET
Abs Immature Granulocytes: 0.05 10*3/uL (ref 0.00–0.07)
Basophils Absolute: 0.1 10*3/uL (ref 0.0–0.1)
Basophils Relative: 0 %
Eosinophils Absolute: 0 10*3/uL (ref 0.0–0.5)
Eosinophils Relative: 0 %
HCT: 42.3 % (ref 39.0–52.0)
Hemoglobin: 14.3 g/dL (ref 13.0–17.0)
Immature Granulocytes: 0 %
Lymphocytes Relative: 17 %
Lymphs Abs: 2.3 10*3/uL (ref 0.7–4.0)
MCH: 29.9 pg (ref 26.0–34.0)
MCHC: 33.8 g/dL (ref 30.0–36.0)
MCV: 88.3 fL (ref 80.0–100.0)
Monocytes Absolute: 1.2 10*3/uL — ABNORMAL HIGH (ref 0.1–1.0)
Monocytes Relative: 9 %
Neutro Abs: 10 10*3/uL — ABNORMAL HIGH (ref 1.7–7.7)
Neutrophils Relative %: 74 %
Platelets: 235 10*3/uL (ref 150–400)
RBC: 4.79 MIL/uL (ref 4.22–5.81)
RDW: 12.2 % (ref 11.5–15.5)
WBC: 13.5 10*3/uL — ABNORMAL HIGH (ref 4.0–10.5)
nRBC: 0 % (ref 0.0–0.2)

## 2021-06-13 LAB — RESP PANEL BY RT-PCR (FLU A&B, COVID) ARPGX2
Influenza A by PCR: NEGATIVE
Influenza B by PCR: NEGATIVE
SARS Coronavirus 2 by RT PCR: NEGATIVE

## 2021-06-13 LAB — RAPID URINE DRUG SCREEN, HOSP PERFORMED
Amphetamines: NOT DETECTED
Barbiturates: NOT DETECTED
Benzodiazepines: NOT DETECTED
Cocaine: NOT DETECTED
Opiates: NOT DETECTED
Tetrahydrocannabinol: POSITIVE — AB

## 2021-06-13 LAB — LACTIC ACID, PLASMA: Lactic Acid, Venous: 1.9 mmol/L (ref 0.5–1.9)

## 2021-06-13 LAB — BASIC METABOLIC PANEL
Anion gap: 10 (ref 5–15)
BUN: 10 mg/dL (ref 6–20)
CO2: 29 mmol/L (ref 22–32)
Calcium: 9.3 mg/dL (ref 8.9–10.3)
Chloride: 96 mmol/L — ABNORMAL LOW (ref 98–111)
Creatinine, Ser: 0.7 mg/dL (ref 0.61–1.24)
GFR, Estimated: >60 mL/min (ref >60)
Glucose, Bld: 206 mg/dL — ABNORMAL HIGH (ref 70–99)
Potassium: 3.2 mmol/L — ABNORMAL LOW (ref 3.5–5.1)
Sodium: 135 mmol/L (ref 135–145)

## 2021-06-13 LAB — TROPONIN I (HIGH SENSITIVITY): Troponin I (High Sensitivity): 2 ng/L (ref <18)

## 2021-06-13 MED ORDER — SODIUM CHLORIDE 0.9 % IV SOLN
500.0000 mg | INTRAVENOUS | Status: DC
  Administered 2021-06-14 – 2021-06-15 (×2): 500 mg via INTRAVENOUS
  Filled 2021-06-13 (×2): qty 5

## 2021-06-13 MED ORDER — GUAIFENESIN ER 600 MG PO TB12: 600.0000 mg | ORAL_TABLET | Freq: Two times a day (BID) | ORAL | Status: DC | PRN

## 2021-06-13 MED ORDER — SODIUM CHLORIDE 0.9 % IV BOLUS
1000.0000 mL | Freq: Once | INTRAVENOUS | Status: AC
  Administered 2021-06-13: 1000 mL via INTRAVENOUS

## 2021-06-13 MED ORDER — ADENOSINE 6 MG/2ML IV SOLN: 6.0000 mg | Freq: Once | INTRAVENOUS | Status: AC

## 2021-06-13 MED ORDER — IOHEXOL 350 MG/ML SOLN
80.0000 mL | Freq: Once | INTRAVENOUS | Status: AC | PRN
  Administered 2021-06-13: 80 mL via INTRAVENOUS

## 2021-06-13 MED ORDER — ONDANSETRON HCL 4 MG/2ML IJ SOLN: 4.0000 mg | Freq: Four times a day (QID) | INTRAMUSCULAR | Status: DC | PRN

## 2021-06-13 MED ORDER — SODIUM CHLORIDE 0.9% FLUSH
3.0000 mL | Freq: Two times a day (BID) | INTRAVENOUS | Status: DC
  Administered 2021-06-14 – 2021-06-15 (×2): 3 mL via INTRAVENOUS

## 2021-06-13 MED ORDER — ONDANSETRON HCL 4 MG PO TABS: 4.0000 mg | ORAL_TABLET | Freq: Four times a day (QID) | ORAL | Status: DC | PRN

## 2021-06-13 MED ORDER — POTASSIUM CHLORIDE 10 MEQ/100ML IV SOLN
10.0000 meq | Freq: Once | INTRAVENOUS | Status: AC
  Administered 2021-06-13: 10 meq via INTRAVENOUS
  Filled 2021-06-13: qty 100

## 2021-06-13 MED ORDER — DILTIAZEM HCL-DEXTROSE 125-5 MG/125ML-% IV SOLN (PREMIX)
5.0000 mg/h | INTRAVENOUS | Status: DC
  Administered 2021-06-13: 5 mg/h via INTRAVENOUS
  Filled 2021-06-13: qty 125

## 2021-06-13 MED ORDER — ACETAMINOPHEN 325 MG PO TABS: 650.0000 mg | ORAL_TABLET | Freq: Four times a day (QID) | ORAL | Status: DC | PRN

## 2021-06-13 MED ORDER — ACETAMINOPHEN 650 MG RE SUPP: 650.0000 mg | Freq: Four times a day (QID) | RECTAL | Status: DC | PRN

## 2021-06-13 MED ORDER — ENOXAPARIN SODIUM 40 MG/0.4ML IJ SOSY
40.0000 mg | PREFILLED_SYRINGE | INTRAMUSCULAR | Status: DC
  Administered 2021-06-13 – 2021-06-14 (×2): 40 mg via SUBCUTANEOUS
  Filled 2021-06-13 (×2): qty 0.4

## 2021-06-13 MED ORDER — SODIUM CHLORIDE 0.9 % IV SOLN
500.0000 mg | Freq: Once | INTRAVENOUS | Status: AC
  Administered 2021-06-13: 500 mg via INTRAVENOUS
  Filled 2021-06-13: qty 5

## 2021-06-13 MED ORDER — ADENOSINE 6 MG/2ML IV SOLN
INTRAVENOUS | Status: AC
  Administered 2021-06-13: 12 mg via INTRAVENOUS
  Filled 2021-06-13: qty 2

## 2021-06-13 MED ORDER — MELATONIN 3 MG PO TABS
3.0000 mg | ORAL_TABLET | Freq: Every day | ORAL | Status: DC
  Administered 2021-06-14 (×2): 3 mg via ORAL
  Filled 2021-06-13 (×2): qty 1

## 2021-06-13 MED ORDER — DILTIAZEM HCL 25 MG/5ML IV SOLN
10.0000 mg | Freq: Once | INTRAVENOUS | Status: AC
  Administered 2021-06-13: 10 mg via INTRAVENOUS
  Filled 2021-06-13: qty 5

## 2021-06-13 MED ORDER — ADENOSINE 6 MG/2ML IV SOLN: 12.0000 mg | Freq: Once | INTRAVENOUS | Status: AC

## 2021-06-13 MED ORDER — SODIUM CHLORIDE 0.9 % IV SOLN: Freq: Once | INTRAVENOUS | Status: AC

## 2021-06-13 MED ORDER — POTASSIUM CHLORIDE 20 MEQ PO PACK
40.0000 meq | PACK | Freq: Once | ORAL | Status: AC
Start: 2021-06-13 — End: 2021-06-13
  Administered 2021-06-13: 40 meq via ORAL
  Filled 2021-06-13: qty 2

## 2021-06-13 MED ORDER — SODIUM CHLORIDE 0.9 % IV SOLN
1.0000 g | Freq: Once | INTRAVENOUS | Status: AC
  Administered 2021-06-13: 1 g via INTRAVENOUS
  Filled 2021-06-13: qty 10

## 2021-06-13 MED ORDER — BUSPIRONE HCL 5 MG PO TABS
15.0000 mg | ORAL_TABLET | Freq: Two times a day (BID) | ORAL | Status: DC
  Administered 2021-06-13 – 2021-06-15 (×4): 15 mg via ORAL
  Filled 2021-06-13: qty 2
  Filled 2021-06-13 (×2): qty 3
  Filled 2021-06-13: qty 2

## 2021-06-13 MED ORDER — SODIUM CHLORIDE 0.9 % IV SOLN
2.0000 g | INTRAVENOUS | Status: DC
  Administered 2021-06-14 – 2021-06-15 (×2): 2 g via INTRAVENOUS
  Filled 2021-06-13 (×2): qty 20

## 2021-06-13 MED ORDER — ADENOSINE 6 MG/2ML IV SOLN
INTRAVENOUS | Status: AC
  Administered 2021-06-13: 3 mg via INTRAVENOUS
  Filled 2021-06-13: qty 6

## 2021-06-13 MED ORDER — PREGABALIN 75 MG PO CAPS
75.0000 mg | ORAL_CAPSULE | Freq: Two times a day (BID) | ORAL | Status: DC
  Administered 2021-06-13 – 2021-06-15 (×4): 75 mg via ORAL
  Filled 2021-06-13 (×4): qty 1

## 2021-06-13 MED ORDER — ADENOSINE 6 MG/2ML IV SOLN
6.0000 mg | Freq: Once | INTRAVENOUS | Status: AC
  Administered 2021-06-13: 6 mg via INTRAVENOUS

## 2021-06-13 NOTE — ED Notes (Signed)
Pt arrived bedside with HR 160. Valarie Merino, MD bedside with verbal orders. See MAR. Pt connected to Zoll monitor and cardiac monitor.

## 2021-06-13 NOTE — ED Provider Notes (Signed)
[facility] DEPT Provider Note   CSN: [id number] Arrival date & time: 06/13/21  1612     History Chief Complaint  Patient presents with   Chest Pain   Shortness of Breath    Derrick Herrera is a 55 y.o. male.  55 year old male with prior medical history as detailed below presents for evaluation.  Patient reports that he was at home resting on his couch.  He reports sudden onset of palpitations.  He felt like his heart was racing.  He complains of mild associated shortness of breath.  He denies associated chest pain.  He reports mild cough intermittently for the last 2 to 3 days.  He denies recent fever.  He does admit to snorting what he thought was heroin 2 days prior.  He reports that the high that resulted "did not feel like normal heroin."  The history is provided by the patient.  Palpitations Palpitations quality:  Regular Onset quality:  Sudden Duration:  1 hour Timing:  Constant Progression:  Worsening Chronicity:  New Relieved by:  Nothing Worsened by:  Nothing     Past Medical History:  Diagnosis Date   Bladder neoplasm    Chronic back pain    Crohn's disease ([place])    dx 1994--  followed by dr Havery Moros -- no current treatment   Depression    Eczema    GAD (generalized anxiety disorder)    History of colon polyps    benign 11/ 2017   History of lower GI bleeding    1995  and 2003 (ileitis)   Sarcoidosis of lung ([place]) followed by pt's pcp   dx 2009 via [facility] -- pulmologist-  dr Tarri Fuller young, lov 2010 (prn visit's)   Wears glasses     Patient Active Problem List   Diagnosis Date Noted   Major depressive disorder, recurrent episode, moderate ([place]) 10/30/2020   Generalized anxiety disorder 10/30/2020   Crohn's disease of small intestine without complication ([place]) 96/78/9381   SARCOIDOSIS 03/27/2008   TOBACCO USER 03/15/2008   SINUSITIS 03/08/2008   SKIN RASH 03/08/2008   LYMPHADENOPATHY 03/08/2008    Past Surgical  History:  Procedure Laterality Date   BRONCHOSCOPY  03/22/2008   dr Tarri Fuller young   w/ BX   COLONOSCOPY  last one 05-10-2016   LUMBAR [place] SURGERY  02/27/2016   L4-L5   TRANSURETHRAL RESECTION OF BLADDER TUMOR WITH MITOMYCIN-C N/A 06/29/2016   Procedure: TRANSURETHRAL RESECTION OF BLADDER TUMOR WITH EPIRUBICIN;  Surgeon: Festus Aloe, MD;  Location: [facility];  Service: Urology;  Laterality: N/A;       Family History  Problem Relation Age of Onset   Colon cancer Maternal Grandmother    Depression Maternal Grandmother    Diabetes Mother    Depression Mother    Depression Sister    Anxiety disorder Sister    Esophageal cancer Neg Hx    Stomach cancer Neg Hx     Social History   Tobacco Use   Smoking status: Every Day    Packs/day: 1.00    Years: 30.00    Pack years: 30.00    Types: Cigarettes   Smokeless tobacco: Never  Substance Use Topics   Alcohol use: Yes    Comment: occasional   Drug use: Yes    Types: Marijuana    Comment: maybe once every few months. H/O cocaine, mushrooms  as a teenager.     Home Medications Prior to Admission medications   Medication Sig Start  Date End Date Taking? Authorizing Provider  busPIRone (BUSPAR) 7.5 MG tablet 1-2 tab po bid for anxiety 05/28/19   Saguier, Percell Miller, PA-C  busPIRone (BUSPAR) 7.5 MG tablet Take 7.5 mg by mouth 2 (two) times daily.    [provider]  COVID-19 mRNA vaccine, Pfizer, 30 MCG/0.3ML injection INJECT AS DIRECTED 06/20/20 06/20/21  Carlyle Basques, MD  cyclobenzaprine (FLEXERIL) 10 MG tablet Take 1 tablet (10 mg total) by mouth at bedtime. 11/21/19   Saguier, Percell Miller, PA-C  DULoxetine (CYMBALTA) 30 MG capsule Take 1 capsule (30 mg total) by mouth daily. 10/29/20   Addison Lank, PA-C  DULoxetine (CYMBALTA) 60 MG capsule Take 1 capsule (60 mg total) by mouth daily. Begin after taking the 30 mg daily for 2 weeks. 10/29/20   Donnal Moat T, PA-C  hydrOXYzine (ATARAX/VISTARIL) 25 MG  tablet Take 0.5-1 tablets (12.5-25 mg total) by mouth 3 (three) times daily as needed. 10/29/20   Donnal Moat T, PA-C  pregabalin (LYRICA) 75 MG capsule Take by mouth. 09/10/20   [provider]    Allergies    Nsaids  Review of Systems   Review of Systems  Cardiovascular:  Positive for palpitations.  All other systems reviewed and are negative.  Physical Exam Updated Vital Signs BP 130/70   Pulse (!) 102   Temp 97.8 F (36.6 C) (Oral)   Resp 12   SpO2 99%   Physical Exam Vitals and nursing note reviewed.  Constitutional:      General: He is not in acute distress.    Appearance: Normal appearance. He is well-developed.  HENT:     Head: Normocephalic and atraumatic.  Eyes:     Conjunctiva/sclera: Conjunctivae normal.     Pupils: Pupils are equal, round, and reactive to light.  Cardiovascular:     Rate and Rhythm: Regular rhythm. Tachycardia present.     Heart sounds: Normal heart sounds.  Pulmonary:     Effort: Pulmonary effort is normal. No respiratory distress.     Breath sounds: Normal breath sounds.  Abdominal:     General: There is no distension.     Palpations: Abdomen is soft.     Tenderness: There is no abdominal tenderness.  Musculoskeletal:        General: No deformity. Normal range of motion.     Cervical back: Normal range of motion and neck supple.  Skin:    General: Skin is warm and dry.  Neurological:     General: No focal deficit present.     Mental Status: He is alert and oriented to person, place, and time.    ED Results / Procedures / Treatments   Labs (all labs ordered are listed, but only abnormal results are displayed) Labs Reviewed  CBC WITH DIFFERENTIAL/PLATELET - Abnormal; Notable for the following components:      Result Value   WBC 13.5 (*)    Neutro Abs 10.0 (*)    Monocytes Absolute 1.2 (*)    All other components within normal limits  BASIC METABOLIC PANEL - Abnormal; Notable for the following components:   Potassium  3.2 (*)    Chloride 96 (*)    Glucose, Bld 206 (*)    All other components within normal limits  RESP PANEL BY RT-PCR (FLU A&B, COVID) ARPGX2  CULTURE, BLOOD (ROUTINE X 2)  CULTURE, BLOOD (ROUTINE X 2)  LACTIC ACID, PLASMA  LACTIC ACID, PLASMA  RAPID URINE DRUG SCREEN, HOSP PERFORMED  TROPONIN I (HIGH SENSITIVITY)  TROPONIN I (  HIGH SENSITIVITY)    EKG EKG Interpretation  Date/Time:  Saturday June 13 2021 16:54:54 EST Ventricular Rate:  126 PR Interval:  113 QRS Duration: 94 QT Interval:  361 QTC Calculation: 523 R Axis:   79 Text Interpretation: Sinus tachycardia Probable left atrial enlargement Nonspecific T abnormalities, inferior leads Prolonged QT interval Confirmed by Dene Gentry [id number]) on 06/13/2021 5:06:36 PM  Radiology DG Chest Port 1 View  Result Date: 06/13/2021 CLINICAL DATA:  Dyspnea EXAM: PORTABLE CHEST 1 VIEW COMPARISON:  2017 FINDINGS: Left basilar consolidation. No pleural effusion or pneumothorax. Stable cardiomediastinal contours with normal heart size. IMPRESSION: Left basilar consolidation suspicious for pneumonia. Electronically Signed   By: Macy Mis M.D.   On: 06/13/2021 16:48    Procedures Procedures   Medications Ordered in ED Medications  diltiazem (CARDIZEM) 125 mg in dextrose 5% 125 mL (1 mg/mL) infusion (5 mg/hr Intravenous New Bag/Given 06/13/21 1720)  potassium chloride 10 mEq in 100 mL IVPB (has no administration in time range)  iohexol (OMNIPAQUE) 350 MG/ML injection 80 mL (has no administration in time range)  adenosine (ADENOCARD) 6 MG/2ML injection 6 mg (6 mg Intravenous Given by Other 06/13/21 1627)  adenosine (ADENOCARD) 6 MG/2ML injection 6 mg (3 mg Intravenous Given by Other 06/13/21 1623)  diltiazem (CARDIZEM) injection 10 mg (10 mg Intravenous Given 06/13/21 1646)  sodium chloride 0.9 % bolus 1,000 mL (0 mLs Intravenous Stopped 06/13/21 1720)  adenosine (ADENOCARD) 6 MG/2ML injection 12 mg (12 mg Intravenous Given  06/13/21 1634)    ED Course  I have reviewed the triage vital signs and the nursing notes.  Pertinent labs & imaging results that were available during my care of the patient were reviewed by me and considered in my medical decision making (see chart for details).    MDM Rules/Calculators/A&P                           CRITICAL CARE Performed by: Valarie Merino   Total critical care time: 45 minutes  Critical care time was exclusive of separately billable procedures and treating other patients.  Critical care was necessary to treat or prevent imminent or life-threatening deterioration.  Critical care was time spent personally by me on the following activities: development of treatment plan with patient and/or surrogate as well as nursing, discussions with consultants, evaluation of patient's response to treatment, examination of patient, obtaining history from patient or surrogate, ordering and performing treatments and interventions, ordering and review of laboratory studies, ordering and review of radiographic studies, pulse oximetry and re-evaluation of patient's condition.   MDM  MSE complete  Derrick Herrera was evaluated in Emergency Department on 06/13/2021 for the symptoms described in the history of present illness. He was evaluated in the context of the global COVID-19 pandemic, which necessitated consideration that the patient might be at risk for infection with the SARS-CoV-2 virus that causes COVID-19. Institutional protocols and algorithms that pertain to the evaluation of patients at risk for COVID-19 are in a state of rapid change based on information released by regulatory bodies including the CDC and federal and state organizations. These policies and algorithms were followed during the patient's care in the ED.  Patient presented with complaint of cute onset palpitations  Initial EKG is concerning for heart rate into the 170s.  Patient tolerated this tachycardia  well - without reported chest pain or hypotension.  Patient given adenosine without tachycardia resolving. Underlying rhythm suspected to be AFIB  with RVR. Cardizem bolus and then gtt initiated.   Concurrently patient noted to be mildly hypoxic with a room air sat in the 90 to 91% range.  This improved with 2 L nasal cannula.  With administration of Cardizem drip patient's rate was controlled from an the 170s down to the 120's.  Underlying rhythm did appear initially to be A. fib.  Patient appears to have converted to a sinus rhythm at time of admission.  Heart rate is in the upper 90s. Cardizem gtt stopped.  CT chest is demonstrative of left-sided pneumonia.  COVID and flu test were negative.  Patient without PE.  Patient would benefit from admission for further work-up and treatment.      Final Clinical Impression(s) / ED Diagnoses Final diagnoses:  None    Rx / DC Orders ED Discharge Orders     None        Valarie Merino, MD 06/13/21 2012

## 2021-06-13 NOTE — ED Notes (Signed)
Assumed pt care at this time. Pt NAD, a/ox4. Pt states he had a racing heart and CP/palpitations today. Per offgoing RN, pt was in SVT. VSS, pt denies current CP, SOB

## 2021-06-13 NOTE — ED Triage Notes (Signed)
PT c/o chest pain and sob that started about 2 hours ago.

## 2021-06-13 NOTE — H&P (Signed)
History and Physical    Derrick Herrera XTK:[id number] DOB: [birth year] DOA: 06/13/2021  PCP: Pcp, No  Patient coming from: Home  I have personally briefly reviewed patient's old medical records in [place]  Chief Complaint: Chest pain, shortness of breath  HPI: Derrick Herrera is a 55 y.o. male with medical history significant for Crohn's disease, GAD/depression, chronic back pain, and substance use who presented to the ED for evaluation of chest pain and dyspnea.  Patient states he was sitting down at home watching TV around 3 PM earlier today (06/13/2021) when he developed sudden palpitations.  He thought he was having a panic attack and tried calming techniques without improvement.  He felt his heart racing and was having some left-sided chest discomfort as well.  He subsequently came to the ED due to concern that he was having a heart attack.  He does report some recent nausea and vomiting over the last week without aspiration.  He has had intermittent nonproductive cough and some shortness of breath.  He denies any subjective fevers, chills, diaphoresis, abdominal pain, dysuria.  He reports smoking 1 pack/day for at least 20 years.  He reports history of recreational opioid use, last used 46 days ago.  He denies any IV drug use.  ED Course:  Initial vitals showed BP 147/90, pulse 153, RR 21, temp 97.8 F, SPO2 91% on room air.  Labs show WBC 13.5, hemoglobin 14.3, platelets 235,000, sodium 135, potassium 3.2, bicarb 29, BUN 10, creatinine 0.70, serum glucose 206, lactic acid 1.9, troponin 2.  COVID and influenza PCR are negative.  Blood cultures collected and pending.  UDS positive for THC.  Portable chest x-ray showed left basilar consolidation.  CTA chest PE study was negative for evidence of PE.  Left upper and lower lobe consolidations seen.  Bilateral solid pulmonary nodules, largest in the left upper lobe measuring up to 1 cm noted.  Coronary artery calcifications of the  LAD and RCA also seen.  Per EDP, heart rate 160-170s on arrival.  Rhythm was concerning for SVT versus A. fib with RVR.  He received adenosine 6 mg and 12 mg without significant response.  He was given IV diltiazem 10 mg bolus followed by continuous drip with improved rate and subsequent conversion to sinus rhythm.  He also received 1 L normal saline, IV ceftriaxone and azithromycin, and IV K 10 mEq x 1.  The hospitalist service was consulted to admit for further evaluation and management.  Review of Systems: All systems reviewed and are negative except as documented in history of present illness above.   Past Medical History:  Diagnosis Date   Bladder neoplasm    Chronic back pain    Crohn's disease ([place])    dx 1994--  followed by dr Havery Moros -- no current treatment   Depression    Eczema    GAD (generalized anxiety disorder)    History of colon polyps    benign 11/ 2017   History of lower GI bleeding    1995  and 2003 (ileitis)   Sarcoidosis of lung ([place]) followed by pt's pcp   dx 2009 via [facility] -- pulmologist-  dr Tarri Fuller young, lov 2010 (prn visit's)   Wears glasses     Past Surgical History:  Procedure Laterality Date   BRONCHOSCOPY  03/22/2008   dr Tarri Fuller young   w/ BX   COLONOSCOPY  last one 05-10-2016   LUMBAR [place] SURGERY  02/27/2016   L4-L5   TRANSURETHRAL RESECTION OF  BLADDER TUMOR WITH MITOMYCIN-C N/A 06/29/2016   Procedure: TRANSURETHRAL RESECTION OF BLADDER TUMOR WITH EPIRUBICIN;  Surgeon: Festus Aloe, MD;  Location[facility];  Service: Urology;  Laterality: N/A;    Social History:  reports that he has been smoking cigarettes. He has a 30.00 pack-year smoking history. He has never used smokeless tobacco. He reports current alcohol use. He reports current drug use. Drug: Marijuana.  Allergies  Allergen Reactions   Nsaids Other (See Comments)    Remote hx lower GI bleed    Family History  Problem Relation Age of Onset   Colon  cancer Maternal Grandmother    Depression Maternal Grandmother    Diabetes Mother    Depression Mother    Depression Sister    Anxiety disorder Sister    Esophageal cancer Neg Hx    Stomach cancer Neg Hx      Prior to Admission medications   Medication Sig Start Date End Date Taking? Authorizing Provider  busPIRone (BUSPAR) 7.5 MG tablet 1-2 tab po bid for anxiety Patient taking differently: Take 15 mg by mouth 2 (two) times daily. 05/28/19  Yes Saguier, Percell Miller, PA-C  pregabalin (LYRICA) 75 MG capsule Take 75 mg by mouth 2 (two) times daily. 02/18/21  Yes [provider]  COVID-19 mRNA vaccine, Pfizer, 30 MCG/0.3ML injection INJECT AS DIRECTED 06/20/20 06/20/21  Carlyle Basques, MD  cyclobenzaprine (FLEXERIL) 10 MG tablet Take 1 tablet (10 mg total) by mouth at bedtime. Patient not taking: Reported on 06/13/2021 11/21/19   Saguier, Percell Miller, PA-C  DULoxetine (CYMBALTA) 30 MG capsule Take 1 capsule (30 mg total) by mouth daily. Patient not taking: Reported on 06/13/2021 10/29/20   Donnal Moat T, PA-C  DULoxetine (CYMBALTA) 60 MG capsule Take 1 capsule (60 mg total) by mouth daily. Begin after taking the 30 mg daily for 2 weeks. Patient not taking: Reported on 06/13/2021 10/29/20   Donnal Moat T, PA-C  hydrOXYzine (ATARAX/VISTARIL) 25 MG tablet Take 0.5-1 tablets (12.5-25 mg total) by mouth 3 (three) times daily as needed. Patient not taking: Reported on 06/13/2021 10/29/20   Addison Lank, [place]    Physical Exam: Vitals:   06/13/21 1745 06/13/21 1800 06/13/21 1832 06/13/21 1845  BP: 132/77 130/70 (!) 147/92 129/78  Pulse: (!) 110 (!) 102 (!) 125 (!) 104  Resp: 15 12 12 12   Temp:   98.3 F (36.8 C)   TempSrc:   Oral   SpO2: 99% 99% 100% 100%   Constitutional: Sitting up in bed, NAD, calm, comfortable Eyes: PERRL, lids and conjunctivae normal ENMT: Mucous membranes are moist. Posterior pharynx clear of any exudate or lesions.Normal dentition.  Neck: normal, supple, no  masses. Respiratory: clear to auscultation bilaterally, no wheezing, no crackles. Normal respiratory effort. No accessory muscle use.  Cardiovascular: Regular rate and rhythm, no murmurs / rubs / gallops. No extremity edema. 2+ pedal pulses. Abdomen: no tenderness, no masses palpated. No hepatosplenomegaly. Bowel sounds positive.  Musculoskeletal: no clubbing / cyanosis. No joint deformity upper and lower extremities. Good ROM, no contractures. Normal muscle tone.  Skin: no rashes, lesions, ulcers. No induration Neurologic: CN 2-12 grossly intact. Sensation intact. Strength 5/5 in all 4.  Psychiatric: Normal judgment and insight. Alert and oriented x 3. Normal mood.   Labs on Admission: I have personally reviewed following labs and imaging studies  CBC: Recent Labs  Lab 06/13/21 1632  WBC 13.5*  NEUTROABS 10.0*  HGB 14.3  HCT 42.3  MCV 88.3  PLT 235  Basic Metabolic Panel: Recent Labs  Lab 06/13/21 1632  NA 135  K 3.2*  CL 96*  CO2 29  GLUCOSE 206*  BUN 10  CREATININE 0.70  CALCIUM 9.3   GFR: CrCl cannot be calculated (Unknown ideal weight.). Liver Function Tests: No results for input(s): AST, ALT, ALKPHOS, BILITOT, PROT, ALBUMIN in the last 168 hours. No results for input(s): LIPASE, AMYLASE in the last 168 hours. No results for input(s): AMMONIA in the last 168 hours. Coagulation Profile: No results for input(s): INR, PROTIME in the last 168 hours. Cardiac Enzymes: No results for input(s): CKTOTAL, CKMB, CKMBINDEX, TROPONINI in the last 168 hours. BNP (last 3 results) No results for input(s): PROBNP in the last 8760 hours. HbA1C: No results for input(s): HGBA1C in the last 72 hours. CBG: No results for input(s): GLUCAP in the last 168 hours. Lipid Profile: No results for input(s): CHOL, HDL, LDLCALC, TRIG, CHOLHDL, LDLDIRECT in the last 72 hours. Thyroid Function Tests: No results for input(s): TSH, T4TOTAL, FREET4, T3FREE, THYROIDAB in the last 72  hours. Anemia Panel: No results for input(s): VITAMINB12, FOLATE, FERRITIN, TIBC, IRON, RETICCTPCT in the last 72 hours. Urine analysis:    Component Value Date/Time   BILIRUBINUR neg 04/22/2016 1048   PROTEINUR neg 04/22/2016 1048   UROBILINOGEN 0.2 04/22/2016 1048   NITRITE pos 04/22/2016 1048   LEUKOCYTESUR small (1+) (A) 04/22/2016 1048    Radiological Exams on Admission: CT Angio Chest PE W and/or Wo Contrast  Result Date: 06/13/2021 CLINICAL DATA:  Chest pain and shortness of breath EXAM: CT ANGIOGRAPHY CHEST WITH CONTRAST TECHNIQUE: Multidetector CT imaging of the chest was performed using the standard protocol during bolus administration of intravenous contrast. Multiplanar CT image reconstructions and MIPs were obtained to evaluate the vascular anatomy. CONTRAST:  66m OMNIPAQUE IOHEXOL 350 MG/ML SOLN COMPARISON:  None. FINDINGS: Cardiovascular: Normal heart size. No pericardial effusion. Adequate contrast opacification of the pulmonary arteries with no evidence of pulmonary embolus. No significant atherosclerotic disease of the thoracic aorta. Calcifications of the LAD and RCA. Mediastinum/Nodes: Small hiatal hernia. Thyroid is unremarkable. Enlarged left hilar lymph node measuring 1.4 cm in short axis on series 4 image 67. Lungs/Pleura: Central airways are patent. Left upper and lower lobe consolidations with surrounding ground-glass opacities. Bilateral solid pulmonary nodules. Largest is located in the left upper lobe and measures 1.0 cm. No pleural effusion or pneumothorax. Upper Abdomen: No acute abnormality. Musculoskeletal: No chest wall abnormality. No acute or significant osseous findings. Review of the MIP images confirms the above findings. IMPRESSION: 1. No evidence of pulmonary embolus. 2. Left upper and lower lobe consolidations, likely due to pneumonia. Recommend attention on 3 month follow-up as recommended below. 3. Bilateral solid pulmonary nodules, largest is located in  the left upper lobe and measures up to 1.0 cm. Consider one of the following in 3 months for both low-risk and high-risk individuals: (a) repeat chest CT, (b) follow-up PET-CT, or (c) tissue sampling. This recommendation follows the consensus statement: Guidelines for Management of Incidental Pulmonary Nodules Detected on CT Images: From the Fleischner Society 2017; Radiology 2017; 284:228-243. 4. Enlarged left hilar lymph node, likely reactive. 5. Coronary artery calcifications of the LAD and RCA, recommend ASCVD risk assessment. Electronically Signed   By: LYetta GlassmanM.D.   On: 06/13/2021 19:22   DG Chest Port 1 View  Result Date: 06/13/2021 CLINICAL DATA:  Dyspnea EXAM: PORTABLE CHEST 1 VIEW COMPARISON:  2017 FINDINGS: Left basilar consolidation. No pleural effusion or pneumothorax. Stable cardiomediastinal  contours with normal heart size. IMPRESSION: Left basilar consolidation suspicious for pneumonia. Electronically Signed   By: Macy Mis M.D.   On: 06/13/2021 16:48    EKG: Personally reviewed. Sinus tachycardia, rate 126, QTc 523.  Repeat EKG showed sinus tachycardia, rate 110, QTc 445.  Previous EKG from 2017 showed sinus tachycardia, rate 101.  Assessment/Plan Principal Problem:   Community acquired pneumonia Active Problems:   Tachycardia   Depression with anxiety   Hypokalemia   Pulmonary nodules   Derrick Herrera is a 55 y.o. male with medical history significant for Crohn's disease, GAD/depression, chronic back pain, and substance use who is admitted with left-sided pneumonia.  Community-acquired left upper and lower lobe pneumonia: Left upper and lower lobe consolidation seen on CT imaging.  Leukocytosis with WBC 13.5 present on admission.  SPO2 mildly decreased to 90-91% on initial ED arrival. -Continue IV ceftriaxone and azithromycin -Follow blood cultures -Check strep pneumonia and Legionella urinary antigens -Supplemental oxygen as needed  Tachycardia: Patient  tachycardic with heart rate 160-170s on ED arrival.  There was concern for SVT versus A. fib with RVR.  EP.  Received adenosine and IV diltiazem bolus followed by drip.  Rates now improved in sinus rhythm.  He is off diltiazem gtt now.  Review of telemetry does not show any A. fib.  Possibly was in SVT.  CTA without evidence of PE, did show coronary artery calcifications of LAD and RCA.  EKGs showed sinus tachycardia without acute ischemic changes.  Troponin negative. -Continue to monitor on telemetry  Hypokalemia: Supplement orally.  Depression/anxiety: Continue BuSpar.  Chronic back pain/sciatica: Continue Lyrica.  Tobacco/Substance use: Reports smoking 1 pack/day for at least 20 years.  Smoking cessation advised.  Patient also reports recreational use of opioid pills, last used 46 days ago.  UDS is positive for THC.  Pulmonary nodules: Findings discussed with patient.  Bilateral solid pulmonary nodules, largest located in the left upper lobe and measuring up to 1.0 cm seen on CTA chest.  Follow-up evaluation in 3 months with repeat chest CT, follow-up PET/CT, or tissue sampling recommended.  DVT prophylaxis: Lovenox Code Status: Full code, confirmed with patient on admission Family Communication: Discussed with patient, he has discussed with family Disposition Plan: From home and likely discharge to home pending clinical progress Consults called: None Level of care: Telemetry Admission status:  Status is: Observation  The patient remains OBS appropriate and will d/c before 2 midnights.   Zada Finders MD [facility]  If 7PM-7AM, please contact night-coverage [email or web address]  06/13/2021, 8:27 PM

## 2021-06-14 ENCOUNTER — Observation Stay (HOSPITAL_COMMUNITY): Payer: BC Managed Care – PPO

## 2021-06-14 DIAGNOSIS — D860 Sarcoidosis of lung: Secondary | ICD-10-CM | POA: Diagnosis present

## 2021-06-14 DIAGNOSIS — Z79899 Other long term (current) drug therapy: Secondary | ICD-10-CM | POA: Diagnosis not present

## 2021-06-14 DIAGNOSIS — G8929 Other chronic pain: Secondary | ICD-10-CM | POA: Diagnosis present

## 2021-06-14 DIAGNOSIS — R918 Other nonspecific abnormal finding of lung field: Secondary | ICD-10-CM

## 2021-06-14 DIAGNOSIS — R9431 Abnormal electrocardiogram [ECG] [EKG]: Secondary | ICD-10-CM

## 2021-06-14 DIAGNOSIS — F32A Depression, unspecified: Secondary | ICD-10-CM | POA: Diagnosis present

## 2021-06-14 DIAGNOSIS — E876 Hypokalemia: Secondary | ICD-10-CM | POA: Diagnosis present

## 2021-06-14 DIAGNOSIS — Z886 Allergy status to analgesic agent status: Secondary | ICD-10-CM | POA: Diagnosis not present

## 2021-06-14 DIAGNOSIS — F418 Other specified anxiety disorders: Secondary | ICD-10-CM

## 2021-06-14 DIAGNOSIS — R000 Tachycardia, unspecified: Secondary | ICD-10-CM

## 2021-06-14 DIAGNOSIS — I2510 Atherosclerotic heart disease of native coronary artery without angina pectoris: Secondary | ICD-10-CM | POA: Diagnosis present

## 2021-06-14 DIAGNOSIS — I471 Supraventricular tachycardia: Secondary | ICD-10-CM | POA: Diagnosis present

## 2021-06-14 DIAGNOSIS — J189 Pneumonia, unspecified organism: Secondary | ICD-10-CM | POA: Diagnosis present

## 2021-06-14 DIAGNOSIS — R911 Solitary pulmonary nodule: Secondary | ICD-10-CM | POA: Diagnosis present

## 2021-06-14 DIAGNOSIS — K5090 Crohn's disease, unspecified, without complications: Secondary | ICD-10-CM | POA: Diagnosis present

## 2021-06-14 DIAGNOSIS — Z818 Family history of other mental and behavioral disorders: Secondary | ICD-10-CM | POA: Diagnosis not present

## 2021-06-14 DIAGNOSIS — Z20822 Contact with and (suspected) exposure to covid-19: Secondary | ICD-10-CM | POA: Diagnosis present

## 2021-06-14 DIAGNOSIS — F17210 Nicotine dependence, cigarettes, uncomplicated: Secondary | ICD-10-CM | POA: Diagnosis present

## 2021-06-14 DIAGNOSIS — F411 Generalized anxiety disorder: Secondary | ICD-10-CM | POA: Diagnosis present

## 2021-06-14 LAB — CBC
HCT: 36.5 % — ABNORMAL LOW (ref 39.0–52.0)
Hemoglobin: 12 g/dL — ABNORMAL LOW (ref 13.0–17.0)
MCH: 29.6 pg (ref 26.0–34.0)
MCHC: 32.9 g/dL (ref 30.0–36.0)
MCV: 90.1 fL (ref 80.0–100.0)
Platelets: 147 10*3/uL — ABNORMAL LOW (ref 150–400)
RBC: 4.05 MIL/uL — ABNORMAL LOW (ref 4.22–5.81)
RDW: 12.4 % (ref 11.5–15.5)
WBC: 7.3 10*3/uL (ref 4.0–10.5)
nRBC: 0 % (ref 0.0–0.2)

## 2021-06-14 LAB — TSH: TSH: 1.243 u[IU]/mL (ref 0.350–4.500)

## 2021-06-14 LAB — ECHOCARDIOGRAM COMPLETE
AR max vel: 2.47 cm2
AV Area VTI: 2.56 cm2
AV Area mean vel: 2.29 cm2
AV Mean grad: 6 mmHg
AV Peak grad: 11.3 mmHg
Ao pk vel: 1.68 m/s
Area-P 1/2: 4.29 cm2
Calc EF: 62.7 %
S' Lateral: 2.4 cm
Single Plane A2C EF: 64.4 %
Single Plane A4C EF: 61.2 %

## 2021-06-14 LAB — BASIC METABOLIC PANEL
Anion gap: 6 (ref 5–15)
BUN: 8 mg/dL (ref 6–20)
CO2: 29 mmol/L (ref 22–32)
Calcium: 8.4 mg/dL — ABNORMAL LOW (ref 8.9–10.3)
Chloride: 102 mmol/L (ref 98–111)
Creatinine, Ser: 0.57 mg/dL — ABNORMAL LOW (ref 0.61–1.24)
GFR, Estimated: >60 mL/min (ref >60)
Glucose, Bld: 100 mg/dL — ABNORMAL HIGH (ref 70–99)
Potassium: 3.6 mmol/L (ref 3.5–5.1)
Sodium: 137 mmol/L (ref 135–145)

## 2021-06-14 LAB — LACTIC ACID, PLASMA: Lactic Acid, Venous: 0.9 mmol/L (ref 0.5–1.9)

## 2021-06-14 LAB — MAGNESIUM: Magnesium: 1.9 mg/dL (ref 1.7–2.4)

## 2021-06-14 LAB — BRAIN NATRIURETIC PEPTIDE: B Natriuretic Peptide: 29.2 pg/mL (ref 0.0–100.0)

## 2021-06-14 LAB — HIV ANTIBODY (ROUTINE TESTING W REFLEX): HIV Screen 4th Generation wRfx: NONREACTIVE

## 2021-06-14 LAB — STREP PNEUMONIAE URINARY ANTIGEN: Strep Pneumo Urinary Antigen: NEGATIVE

## 2021-06-14 LAB — PROCALCITONIN: Procalcitonin: <0.1 ng/mL

## 2021-06-14 MED ORDER — POTASSIUM CHLORIDE CRYS ER 20 MEQ PO TBCR
40.0000 meq | EXTENDED_RELEASE_TABLET | Freq: Once | ORAL | Status: AC
  Administered 2021-06-14: 40 meq via ORAL
  Filled 2021-06-14: qty 2

## 2021-06-14 MED ORDER — SENNOSIDES-DOCUSATE SODIUM 8.6-50 MG PO TABS: 1.0000 | ORAL_TABLET | Freq: Every evening | ORAL | Status: DC | PRN

## 2021-06-14 MED ORDER — ALPRAZOLAM 0.5 MG PO TABS
0.5000 mg | ORAL_TABLET | Freq: Four times a day (QID) | ORAL | Status: DC | PRN
  Administered 2021-06-14 – 2021-06-15 (×2): 0.5 mg via ORAL
  Filled 2021-06-14 (×2): qty 1

## 2021-06-14 MED ORDER — HYDRALAZINE HCL 20 MG/ML IJ SOLN: 10.0000 mg | INTRAMUSCULAR | Status: DC | PRN

## 2021-06-14 MED ORDER — SODIUM CHLORIDE 0.9 % IV BOLUS
1000.0000 mL | Freq: Once | INTRAVENOUS | Status: AC
Start: 2021-06-14 — End: 2021-06-14
  Administered 2021-06-14: 1000 mL via INTRAVENOUS

## 2021-06-14 MED ORDER — METOPROLOL TARTRATE 5 MG/5ML IV SOLN: 5.0000 mg | INTRAVENOUS | Status: DC | PRN

## 2021-06-14 NOTE — Progress Notes (Signed)
PROGRESS NOTE    Derrick Herrera  ZOX:[id number] DOB: 1965/08/27 DOA: 06/13/2021 PCP: Pcp, No   Brief Narrative:  55 year old with history of Crohn's, GAD/depression, chronic back pain, substance use came to the ER with complaints of chest pain and dyspnea.  He was found to be in SVT requiring 2 doses of IV adenosine.  He also received a bolus of Cardizem.  Initially thought he had pneumonia therefore started on ceftriaxone and azithromycin.  CTA was negative for PE but showed left upper and lower lobe consolidations and pulmonary nodule.   Assessment & Plan:   Principal Problem:   Community acquired pneumonia Active Problems:   Tachycardia   Depression with anxiety   Hypokalemia   Pulmonary nodules   Tachycardia, SVT - Initially required 2 doses of IV adenosine.  Currently his heart rate is still around 100 with stable blood pressure.  Repeat EKG shows sinus tachycardia.  Troponins are flat.  We will check his TSH, echocardiogram.  Give 1 L normal saline bolus. Only drinks alcohol socially.  Hypokalemia - Improved.  Continue to monitor and replete as necessary  Left-sided upper and lower lobe pneumonia - Currently on empiric IV Rocephin and azithromycin.  Check procalcitonin level.  As needed bronchodilators.  I-S/flutter if necessary.  Pulmonary nodule/tobacco use - Patient is highly encouraged to repeat his CT scan in about 3 months.  This should be followed up with PCP.  Given his history of tobacco use, these nodules need to be monitored closely.  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    DVT prophylaxis: Lovenox Code Status: Full code Family Communication:    Patient is to remain in the hospital until heart rate is improved in the meantime receiving IV bolus today, continue to monitor on telemetry.  Checking echocardiogram.  Hopefully he can go home in next 24 hours if his heart  rhythm stays stable.     Subjective: Seen and examined at bedside, heart rate is still in 100-115.  Blood pressure stable.  States he feels much better compared to yesterday when all of a sudden his heart started racing for a long period of time.  Earlier last week he said he felt weak with nausea vomiting and some cough.  This is improved since then.  He smokes 1 pack of cigarettes daily, relapsed last week once and used heroin, drinks alcohol only socially.  Review of Systems Otherwise negative except as per HPI, including: General: Denies fever, chills, night sweats or unintended weight loss. Resp: Denies cough, wheezing, shortness of breath. Cardiac: Denies chest pain, palpitations, orthopnea, paroxysmal nocturnal dyspnea. GI: Denies abdominal pain, nausea, vomiting, diarrhea or constipation GU: Denies dysuria, frequency, hesitancy or incontinence MS: Denies muscle aches, joint pain or swelling Neuro: Denies headache, neurologic deficits (focal weakness, numbness, tingling), abnormal gait Psych: Denies anxiety, depression, SI/HI/AVH Skin: Denies new rashes or lesions ID: Denies sick contacts, exotic exposures, travel  Examination:  General exam: Appears calm and comfortable  Respiratory system: Clear to auscultation. Respiratory effort normal. Cardiovascular system: Sinus tachycardia, no edema noted in lower extremities. Gastrointestinal system: Abdomen is nondistended, soft and nontender. No organomegaly or masses felt. Normal bowel sounds heard. Central nervous system: Alert and oriented. No focal neurological deficits. Extremities: Symmetric 5 x 5 power. Skin: No rashes, lesions or ulcers Psychiatry: Judgement and insight appear normal. Mood & affect appropriate.     Objective: Vitals:   06/14/21 0100 06/14/21 8119  06/14/21 0815 06/14/21 0900  BP: (!) 98/47 133/75 133/89 124/84  Pulse: 62 93 85 78  Resp: 14 20 18    Temp:      TempSrc:      SpO2: 97% 98% 98% 97%     Intake/Output Summary (Last 24 hours) at 06/14/2021 1007 Last data filed at 06/14/2021 0945 Gross per 24 hour  Intake 2352.1 ml  Output --  Net 2352.1 ml   There were no vitals filed for this visit.   Data Reviewed:   CBC: Recent Labs  Lab 06/13/21 1632 06/14/21 0559  WBC 13.5* 7.3  NEUTROABS 10.0*  --   HGB 14.3 12.0*  HCT 42.3 36.5*  MCV 88.3 90.1  PLT 235 845*   Basic Metabolic Panel: Recent Labs  Lab 06/13/21 1632 06/14/21 0557 06/14/21 0559  NA 135  --  137  K 3.2*  --  3.6  CL 96*  --  102  CO2 29  --  29  GLUCOSE 206*  --  100*  BUN 10  --  8  CREATININE 0.70  --  0.57*  CALCIUM 9.3  --  8.4*  MG  --  1.9  --    GFR: CrCl cannot be calculated (Unknown ideal weight.). Liver Function Tests: No results for input(s): AST, ALT, ALKPHOS, BILITOT, PROT, ALBUMIN in the last 168 hours. No results for input(s): LIPASE, AMYLASE in the last 168 hours. No results for input(s): AMMONIA in the last 168 hours. Coagulation Profile: No results for input(s): INR, PROTIME in the last 168 hours. Cardiac Enzymes: No results for input(s): CKTOTAL, CKMB, CKMBINDEX, TROPONINI in the last 168 hours. BNP (last 3 results) No results for input(s): PROBNP in the last 8760 hours. HbA1C: No results for input(s): HGBA1C in the last 72 hours. CBG: No results for input(s): GLUCAP in the last 168 hours. Lipid Profile: No results for input(s): CHOL, HDL, LDLCALC, TRIG, CHOLHDL, LDLDIRECT in the last 72 hours. Thyroid Function Tests: No results for input(s): TSH, T4TOTAL, FREET4, T3FREE, THYROIDAB in the last 72 hours. Anemia Panel: No results for input(s): VITAMINB12, FOLATE, FERRITIN, TIBC, IRON, RETICCTPCT in the last 72 hours. Sepsis Labs: Recent Labs  Lab 06/13/21 1658 06/14/21 0557  LATICACIDVEN 1.9 0.9    Recent Results (from the past 240 hour(s))  Resp Panel by RT-PCR (Flu A&B, Covid) Nasopharyngeal Swab     Status: None   Collection Time: 06/13/21  4:58 PM    Specimen: Nasopharyngeal Swab; Nasopharyngeal(NP) swabs in vial transport medium  Result Value Ref Range Status   SARS Coronavirus 2 by RT PCR NEGATIVE NEGATIVE Final    Comment: (NOTE) SARS-CoV-2 target nucleic acids are NOT DETECTED.  The SARS-CoV-2 RNA is generally detectable in upper respiratory specimens during the acute phase of infection. The lowest concentration of SARS-CoV-2 viral copies this assay can detect is 138 copies/mL. A negative result does not preclude SARS-Cov-2 infection and should not be used as the sole basis for treatment or other patient management decisions. A negative result may occur with  improper specimen collection/handling, submission of specimen other than nasopharyngeal swab, presence of viral mutation(s) within the areas targeted by this assay, and inadequate number of viral copies(<138 copies/mL). A negative result must be combined with clinical observations, patient history, and epidemiological information. The expected result is Negative.  Fact Sheet for Patients:  EntrepreneurPulse.com.au  Fact Sheet for Healthcare Providers:  IncredibleEmployment.be  This test is no t yet approved or cleared by the [place] FDA and  has  been authorized for detection and/or diagnosis of SARS-CoV-2 by FDA under an Emergency Use Authorization (EUA). This EUA will remain  in effect (meaning this test can be used) for the duration of the COVID-19 declaration under Section 564(b)(1) of the Act, 21 U.S.C.section 360bbb-3(b)(1), unless the authorization is terminated  or revoked sooner.       Influenza A by PCR NEGATIVE NEGATIVE Final   Influenza B by PCR NEGATIVE NEGATIVE Final    Comment: (NOTE) The Xpert Xpress SARS-CoV-2/FLU/RSV plus assay is intended as an aid in the diagnosis of influenza from Nasopharyngeal swab specimens and should not be used as a sole basis for treatment. Nasal washings and aspirates are  unacceptable for Xpert Xpress SARS-CoV-2/FLU/RSV testing.  Fact Sheet for Patients: EntrepreneurPulse.com.au  Fact Sheet for Healthcare Providers: IncredibleEmployment.be  This test is not yet approved or cleared by the [place] FDA and has been authorized for detection and/or diagnosis of SARS-CoV-2 by FDA under an Emergency Use Authorization (EUA). This EUA will remain in effect (meaning this test can be used) for the duration of the COVID-19 declaration under Section 564(b)(1) of the Act, 21 U.S.C. section 360bbb-3(b)(1), unless the authorization is terminated or revoked.  Performed at [facility], [place] [address] [place], [place] [address]   Culture, blood (routine x 2)     Status: None (Preliminary result)   Collection Time: 06/13/21  5:03 PM   Specimen: BLOOD  Result Value Ref Range Status   Specimen Description   Final    BLOOD BLOOD LEFT FOREARM Performed at [facility] [address] [place], [place] [address]    Special Requests   Final    BOTTLES DRAWN AEROBIC AND ANAEROBIC Blood Culture adequate volume Performed at [facility] [address] [place], [place] [address]    Culture   Final    NO GROWTH < 12 HOURS Performed at [facility] [address], [place], [place] [address]    Report Status PENDING  Incomplete  Culture, blood (routine x 2)     Status: None (Preliminary result)   Collection Time: 06/13/21  5:30 PM   Specimen: BLOOD  Result Value Ref Range Status   Specimen Description   Final    BLOOD BLOOD RIGHT FOREARM Performed at [facility] [address] [place], [place] [address]    Special Requests   Final    BOTTLES DRAWN AEROBIC AND ANAEROBIC Blood Culture results may not be optimal due to an inadequate volume of blood received in culture bottles Performed at [facility] [address]  [place], [place] [address]    Culture   Final    NO GROWTH < 12 HOURS Performed at [facility] [address] [place], [place] [address]    Report Status PENDING  Incomplete         Radiology Studies: CT Angio Chest PE W and/or Wo Contrast  Result Date: 06/13/2021 CLINICAL DATA:  Chest pain and shortness of breath EXAM: CT ANGIOGRAPHY CHEST WITH CONTRAST TECHNIQUE: Multidetector CT imaging of the chest was performed using the standard protocol during bolus administration of intravenous contrast. Multiplanar CT image reconstructions and MIPs were obtained to evaluate the vascular anatomy. CONTRAST:  35m OMNIPAQUE IOHEXOL 350 MG/ML SOLN COMPARISON:  None. FINDINGS: Cardiovascular: Normal heart size. No pericardial effusion. Adequate contrast opacification of the pulmonary arteries with no evidence of pulmonary embolus. No significant atherosclerotic disease of the thoracic aorta.  Calcifications of the LAD and RCA. Mediastinum/Nodes: Small hiatal hernia. Thyroid is unremarkable. Enlarged left hilar lymph node measuring 1.4 cm in short axis on series 4 image 67. Lungs/Pleura: Central airways are patent. Left upper and lower lobe consolidations with surrounding ground-glass opacities. Bilateral solid pulmonary nodules. Largest is located in the left upper lobe and measures 1.0 cm. No pleural effusion or pneumothorax. Upper Abdomen: No acute abnormality. Musculoskeletal: No chest wall abnormality. No acute or significant osseous findings. Review of the MIP images confirms the above findings. IMPRESSION: 1. No evidence of pulmonary embolus. 2. Left upper and lower lobe consolidations, likely due to pneumonia. Recommend attention on 3 month follow-up as recommended below. 3. Bilateral solid pulmonary nodules, largest is located in the left upper lobe and measures up to 1.0 cm. Consider one of the following in 3 months for both low-risk and high-risk individuals: (a) repeat chest CT, (b) follow-up  PET-CT, or (c) tissue sampling. This recommendation follows the consensus statement: Guidelines for Management of Incidental Pulmonary Nodules Detected on CT Images: From the Fleischner Society 2017; Radiology 2017; 284:228-243. 4. Enlarged left hilar lymph node, likely reactive. 5. Coronary artery calcifications of the LAD and RCA, recommend ASCVD risk assessment. Electronically Signed   By: Yetta Glassman M.D.   On: 06/13/2021 19:22   DG Chest Port 1 View  Result Date: 06/13/2021 CLINICAL DATA:  Dyspnea EXAM: PORTABLE CHEST 1 VIEW COMPARISON:  2017 FINDINGS: Left basilar consolidation. No pleural effusion or pneumothorax. Stable cardiomediastinal contours with normal heart size. IMPRESSION: Left basilar consolidation suspicious for pneumonia. Electronically Signed   By: Macy Mis M.D.   On: 06/13/2021 16:48        Scheduled Meds:  busPIRone  15 mg Oral BID   enoxaparin (LOVENOX) injection  40 mg Subcutaneous Q24H   melatonin  3 mg Oral QHS   pregabalin  75 mg Oral BID   sodium chloride flush  3 mL Intravenous Q12H   Continuous Infusions:  azithromycin Stopped (06/14/21 0945)   cefTRIAXone (ROCEPHIN)  IV Stopped (06/14/21 0930)   sodium chloride       LOS: 0 days   Time spent= 35 mins    Liliana Dang Arsenio Loader, MD [facility]  If 7PM-7AM, please contact night-coverage  06/14/2021, 10:07 AM

## 2021-06-14 NOTE — ED Notes (Signed)
Patient is resting comfortably. 

## 2021-06-15 LAB — BASIC METABOLIC PANEL
Anion gap: 6 (ref 5–15)
BUN: <5 mg/dL — ABNORMAL LOW (ref 6–20)
CO2: 30 mmol/L (ref 22–32)
Calcium: 8.6 mg/dL — ABNORMAL LOW (ref 8.9–10.3)
Chloride: 103 mmol/L (ref 98–111)
Creatinine, Ser: 0.54 mg/dL — ABNORMAL LOW (ref 0.61–1.24)
GFR, Estimated: >60 mL/min (ref >60)
Glucose, Bld: 103 mg/dL — ABNORMAL HIGH (ref 70–99)
Potassium: 3.8 mmol/L (ref 3.5–5.1)
Sodium: 139 mmol/L (ref 135–145)

## 2021-06-15 LAB — MAGNESIUM: Magnesium: 2 mg/dL (ref 1.7–2.4)

## 2021-06-15 MED ORDER — METOPROLOL TARTRATE 25 MG PO TABS
12.5000 mg | ORAL_TABLET | Freq: Two times a day (BID) | ORAL | Status: DC
  Administered 2021-06-15: 12.5 mg via ORAL
  Filled 2021-06-15: qty 1

## 2021-06-15 MED ORDER — ALPRAZOLAM 0.5 MG PO TABS: 0.5000 mg | ORAL_TABLET | Freq: Two times a day (BID) | ORAL | 0 refills | Status: AC | PRN

## 2021-06-15 MED ORDER — METOPROLOL TARTRATE 25 MG PO TABS: 12.5000 mg | ORAL_TABLET | Freq: Two times a day (BID) | ORAL | 0 refills | Status: AC | PRN

## 2021-06-15 MED ORDER — POTASSIUM CHLORIDE CRYS ER 20 MEQ PO TBCR
20.0000 meq | EXTENDED_RELEASE_TABLET | Freq: Once | ORAL | Status: AC
  Administered 2021-06-15: 20 meq via ORAL
  Filled 2021-06-15: qty 1

## 2021-06-15 NOTE — TOC Transition Note (Signed)
Transition of Care [facility]) - CM/SW Discharge Note   Patient Details  Name: Varnell Orvis MRN: [id number] Date of Birth: [birth year]  [facility] CM/SW Contact:  Dessa Phi, RN Phone Number: 06/15/2021, 10:38 AM   Clinical Narrative: d/c home. Patient is already in the process of getting a new pcp-Informed patient of options for locating a pcp-insurance;google, [facility]-patient voiced understanding.  No further CM needs.    Final next level of care: Home/Self Care Barriers to Discharge: No Barriers Identified   Patient Goals and CMS Choice        Discharge Placement                       Discharge Plan and Services                                     Social Determinants of Health (SDOH) Interventions     Readmission Risk Interventions No flowsheet data found.

## 2021-06-15 NOTE — Discharge Summary (Signed)
DC Summary Labelled as "Progress note" from 06/15/21 11:07am.

## 2021-06-15 NOTE — Discharge Summary (Signed)
Physician Discharge Summary  Derrick Herrera JQG:[id number] DOB: [birth year] DOA: 06/13/2021  PCP: Pcp, No  Admit date: 06/13/2021 Discharge date: 06/15/2021  Admitted From: Home Disposition: Home  Recommendations for Outpatient Follow-up:  Follow up with PCP in 1-2 weeks Please obtain BMP/CBC in one week your next doctors visit.  Metoprolol 12.5 mg twice daily as needed for heart rate greater than 110 Follow-up outpatient cardiology in next 4 to 6 weeks, referral given Xanax half a milligram twice daily as needed for anxiety Advised to quit smoking cigarettes.  He will need outpatient follow-up with PCP.  Recommending CT chest in 3 months.  Patient is aware of this.  Discharge Condition: Stable CODE STATUS: Full code Diet recommendation: Regular  Brief/Interim Summary: 55 year old with history of Crohn's, GAD/depression, chronic back pain, substance use came to the ER with complaints of chest pain and dyspnea.  He was found to be in SVT requiring 2 doses of IV adenosine.  He also received a bolus of Cardizem.  Initially thought he had pneumonia therefore started on ceftriaxone and azithromycin.  CTA was negative for PE but showed left upper and lower lobe consolidations and pulmonary nodule.  Eventually antibiotics were stopped, procalcitonin was negative.  He was monitored on telemetry overnight which did not show any acute events.  Echocardiogram was unremarkable, TSH was normal.  He is being discharged today in stable condition with outpatient follow-up with cardiology in the next 4-6 weeks, as needed Lopressor has been prescribed.  Xanax has also been prescribed to help with his anxiety. Rest of the recommendations as stated above      Assessment & Plan:   Principal Problem:   Community acquired pneumonia Active Problems:   Tachycardia   Depression with anxiety   Hypokalemia   Pulmonary nodules     Tachycardia, SVT, resolved - Initially required 2 doses of IV adenosine.   Currently his heart rate is still around 100 with stable blood pressure.  Repeat EKG shows sinus tachycardia.  Troponins are flat.  Only drinks alcohol socially.  Over next 24 hours the symptoms resolved.  He remained in normal sinus rhythm, no acute events on the telemetry was noted.  Echocardiogram was overall unremarkable.  Recommendations as stated above.   Hypokalemia - Improved   Left-sided upper and lower lobe pneumonia - Procalcitonin is negative, no evidence of infection.   Pulmonary nodule/tobacco use - Patient is highly encouraged to repeat his CT scan in about 3 months.  This should be followed up with PCP.  Given his history of tobacco use, these nodules need to be monitored closely.   History of GAD/depression - Continue BuSpar, Lyrica   Substance abuse - He is aware he needs to quit using any illicit drugs      Body mass index is 28.02 kg/m.     Discharge Diagnoses:  Principal Problem:   Community acquired pneumonia Active Problems:   Tachycardia   Depression with anxiety   Hypokalemia   Pulmonary nodules   CAP (community acquired pneumonia)      Consultations: None  Subjective: Feels great he wishes to be discharged today with outpatient follow-up.  No acute events overnight.    Discharge Exam: Vitals:   06/15/21 0556 06/15/21 1023  BP: 140/88 (!) 143/86  Pulse: 88   Resp: 20   Temp: 98.7 F (37.1 C)   SpO2: 100%    Vitals:   06/14/21 2010 06/15/21 0046 06/15/21 0556 06/15/21 1023  BP: (!) 141/84 127/75 140/88 (!) 143/86  Pulse: 96 75 88   Resp: 20  20   Temp: 100 F (37.8 C) 98.4 F (36.9 C) 98.7 F (37.1 C)   TempSrc: Oral Oral Oral   SpO2: 100% 97% 100%   Weight:        General: Pt is alert, awake, not in acute distress Cardiovascular: RRR, S1/S2 +, no rubs, no gallops Respiratory: CTA bilaterally, no wheezing, no rhonchi Abdominal: Soft, NT, ND, bowel sounds + Extremities: no edema, no cyanosis  Discharge  Instructions  Discharge Instructions     Ambulatory referral to Cardiology   Complete by: As directed       Allergies as of 06/15/2021       Reactions   Nsaids Other (See Comments)   Remote hx lower GI bleed        Medication List     TAKE these medications    ALPRAZolam 0.5 MG tablet Commonly known as: XANAX Take 1 tablet (0.5 mg total) by mouth 2 (two) times daily as needed for up to 10 doses for anxiety.   busPIRone 7.5 MG tablet Commonly known as: BUSPAR 1-2 tab po bid for anxiety What changed:  how much to take how to take this when to take this additional instructions   cyclobenzaprine 10 MG tablet Commonly known as: FLEXERIL Take 1 tablet (10 mg total) by mouth at bedtime.   DULoxetine 30 MG capsule Commonly known as: Cymbalta Take 1 capsule (30 mg total) by mouth daily.   DULoxetine 60 MG capsule Commonly known as: Cymbalta Take 1 capsule (60 mg total) by mouth daily. Begin after taking the 30 mg daily for 2 weeks.   hydrOXYzine 25 MG tablet Commonly known as: ATARAX Take 0.5-1 tablets (12.5-25 mg total) by mouth 3 (three) times daily as needed.   metoprolol tartrate 25 MG tablet Commonly known as: LOPRESSOR Take 0.5 tablets (12.5 mg total) by mouth 2 (two) times daily as needed (HR >100).   Pfizer-BioNTech COVID-19 Vacc 30 MCG/0.3ML injection Generic drug: COVID-19 mRNA vaccine (Pfizer) INJECT AS DIRECTED   pregabalin 75 MG capsule Commonly known as: LYRICA Take 75 mg by mouth 2 (two) times daily.        Follow-up Information     [facility]. Schedule an appointment as soon as possible for a visit in 1 week(s).   Why: If symptoms worsen Contact information: [place] [id number] [phone]               Allergies  Allergen Reactions   Nsaids Other (See Comments)    Remote hx lower GI bleed    You were cared for by a hospitalist during your hospital stay.  If you have any questions about your discharge medications or the care you received while you were in the hospital after you are discharged, you can call the unit and asked to speak with the hospitalist on call if the hospitalist that took care of you is not available. Once you are discharged, your primary care physician will handle any further medical issues. Please note that no refills for any discharge medications will be authorized once you are discharged, as it is imperative that you return to your primary care physician (or establish a relationship with a primary care physician if you do not have one) for your aftercare needs so that they can reassess your need for medications and monitor your lab values.   Procedures/Studies: CT Angio Chest PE W and/or Wo  Contrast  Result Date: 06/13/2021 CLINICAL DATA:  Chest pain and shortness of breath EXAM: CT ANGIOGRAPHY CHEST WITH CONTRAST TECHNIQUE: Multidetector CT imaging of the chest was performed using the standard protocol during bolus administration of intravenous contrast. Multiplanar CT image reconstructions and MIPs were obtained to evaluate the vascular anatomy. CONTRAST:  49m OMNIPAQUE IOHEXOL 350 MG/ML SOLN COMPARISON:  None. FINDINGS: Cardiovascular: Normal heart size. No pericardial effusion. Adequate contrast opacification of the pulmonary arteries with no evidence of pulmonary embolus. No significant atherosclerotic disease of the thoracic aorta. Calcifications of the LAD and RCA. Mediastinum/Nodes: Small hiatal hernia. Thyroid is unremarkable. Enlarged left hilar lymph node measuring 1.4 cm in short axis on series 4 image 67. Lungs/Pleura: Central airways are patent. Left upper and lower lobe consolidations with surrounding ground-glass opacities. Bilateral solid pulmonary nodules. Largest is located in the left upper lobe and measures 1.0 cm. No pleural effusion or pneumothorax. Upper Abdomen: No acute abnormality. Musculoskeletal: No chest  wall abnormality. No acute or significant osseous findings. Review of the MIP images confirms the above findings. IMPRESSION: 1. No evidence of pulmonary embolus. 2. Left upper and lower lobe consolidations, likely due to pneumonia. Recommend attention on 3 month follow-up as recommended below. 3. Bilateral solid pulmonary nodules, largest is located in the left upper lobe and measures up to 1.0 cm. Consider one of the following in 3 months for both low-risk and high-risk individuals: (a) repeat chest CT, (b) follow-up PET-CT, or (c) tissue sampling. This recommendation follows the consensus statement: Guidelines for Management of Incidental Pulmonary Nodules Detected on CT Images: From the Fleischner Society 2017; Radiology 2017; 284:228-243. 4. Enlarged left hilar lymph node, likely reactive. 5. Coronary artery calcifications of the LAD and RCA, recommend ASCVD risk assessment. Electronically Signed   By: LYetta GlassmanM.D.   On: 06/13/2021 19:22   DG Chest Port 1 View  Result Date: 06/13/2021 CLINICAL DATA:  Dyspnea EXAM: PORTABLE CHEST 1 VIEW COMPARISON:  2017 FINDINGS: Left basilar consolidation. No pleural effusion or pneumothorax. Stable cardiomediastinal contours with normal heart size. IMPRESSION: Left basilar consolidation suspicious for pneumonia. Electronically Signed   By: PMacy MisM.D.   On: 06/13/2021 16:48   ECHOCARDIOGRAM COMPLETE  Result Date: 06/14/2021    ECHOCARDIOGRAM REPORT   Patient Name:   Derrick BREAKERDate of Exam: 06/14/2021 Medical Rec #:  [id number]    Height:       73.0 in Accession #:    [id number]   Weight:       200.0 lb Date of Birth:  507-23-1967    BSA:          2.152 m Patient Age:    89years      BP:           124/84 mmHg Patient Gender: M             HR:           98 bpm. Exam Location:  Inpatient Procedure: 2D Echo, Cardiac Doppler and Color Doppler Indications:    R94.31 Abnormal EKG  History:        Patient has no prior history of Echocardiogram  examinations.                 Arrythmias:Tachycardia.  Sonographer:    BGlo HerringReferring Phys: [id number] CHIRAG Faith Branan IMPRESSIONS  1. Left ventricular ejection fraction, by estimation, is 60 to 65%. The left ventricle has normal function. The left ventricle has no  regional wall motion abnormalities. Left ventricular diastolic parameters were normal.  2. Right ventricular systolic function is normal. The right ventricular size is normal. Tricuspid regurgitation signal is inadequate for assessing PA pressure.  3. The mitral valve is grossly normal. Trivial mitral valve regurgitation. No evidence of mitral stenosis.  4. The aortic valve is tricuspid. Aortic valve regurgitation is not visualized. No aortic stenosis is present.  5. The inferior vena cava is dilated in size with >50% respiratory variability, suggesting right atrial pressure of 8 mmHg. Conclusion(s)/Recommendation(s): Normal biventricular function without evidence of hemodynamically significant valvular heart disease. FINDINGS  Left Ventricle: Left ventricular ejection fraction, by estimation, is 60 to 65%. The left ventricle has normal function. The left ventricle has no regional wall motion abnormalities. The left ventricular internal cavity size was normal in size. There is  no left ventricular hypertrophy. Left ventricular diastolic parameters were normal. Right Ventricle: The right ventricular size is normal. No increase in right ventricular wall thickness. Right ventricular systolic function is normal. Tricuspid regurgitation signal is inadequate for assessing PA pressure. Left Atrium: Left atrial size was normal in size. Right Atrium: Right atrial size was normal in size. Pericardium: Trivial pericardial effusion is present. Mitral Valve: The mitral valve is grossly normal. Trivial mitral valve regurgitation. No evidence of mitral valve stenosis. Tricuspid Valve: The tricuspid valve is grossly normal. Tricuspid valve regurgitation is not  demonstrated. No evidence of tricuspid stenosis. Aortic Valve: The aortic valve is tricuspid. Aortic valve regurgitation is not visualized. No aortic stenosis is present. Aortic valve mean gradient measures 6.0 mmHg. Aortic valve peak gradient measures 11.3 mmHg. Aortic valve area, by VTI measures 2.56  cm. Pulmonic Valve: The pulmonic valve was grossly normal. Pulmonic valve regurgitation is not visualized. No evidence of pulmonic stenosis. Aorta: The aortic root and ascending aorta are structurally normal, with no evidence of dilitation. Venous: The inferior vena cava is dilated in size with greater than 50% respiratory variability, suggesting right atrial pressure of 8 mmHg. IAS/Shunts: The atrial septum is grossly normal.  LEFT VENTRICLE PLAX 2D LVIDd:         4.40 cm      Diastology LVIDs:         2.40 cm      LV e' medial:    11.00 cm/s LV PW:         1.00 cm      LV E/e' medial:  11.6 LV IVS:        1.00 cm      LV e' lateral:   14.90 cm/s LVOT diam:     2.10 cm      LV E/e' lateral: 8.6 LV SV:         82 LV SV Index:   38 LVOT Area:     3.46 cm  LV Volumes (MOD) LV vol d, MOD A2C: 112.0 ml LV vol d, MOD A4C: 124.0 ml LV vol s, MOD A2C: 39.9 ml LV vol s, MOD A4C: 48.1 ml LV SV MOD A2C:     72.1 ml LV SV MOD A4C:     124.0 ml LV SV MOD BP:      74.0 ml RIGHT VENTRICLE             IVC RV Basal diam:  3.80 cm     IVC diam: 2.30 cm RV Mid diam:    3.00 cm RV S prime:     17.20 cm/s LEFT ATRIUM  Index        RIGHT ATRIUM           Index LA diam:        4.30 cm 2.00 cm/m   RA Area:     19.00 cm LA Vol (A2C):   59.2 ml 27.51 ml/m  RA Volume:   53.00 ml  24.63 ml/m LA Vol (A4C):   57.6 ml 26.77 ml/m LA Biplane Vol: 60.3 ml 28.03 ml/m  AORTIC VALVE                     PULMONIC VALVE AV Area (Vmax):    2.47 cm      PV Vmax:       1.27 m/s AV Area (Vmean):   2.29 cm      PV Peak grad:  6.5 mmHg AV Area (VTI):     2.56 cm AV Vmax:           168.00 cm/s AV Vmean:          118.000 cm/s AV VTI:             0.319 m AV Peak Grad:      11.3 mmHg AV Mean Grad:      6.0 mmHg LVOT Vmax:         120.00 cm/s LVOT Vmean:        78.000 cm/s LVOT VTI:          0.236 m LVOT/AV VTI ratio: 0.74  AORTA Ao Root diam: 3.30 cm MITRAL VALVE MV Area (PHT): 4.29 cm     SHUNTS MV Decel Time: 177 msec     Systemic VTI:  0.24 m MV E velocity: 128.00 cm/s  Systemic Diam: 2.10 cm MV A velocity: 77.70 cm/s MV E/A ratio:  1.65 Eleonore Chiquito MD Electronically signed by Eleonore Chiquito MD Signature Date/Time: 06/14/2021/3:23:21 PM    Final      The results of significant diagnostics from this hospitalization (including imaging, microbiology, ancillary and laboratory) are listed below for reference.     Microbiology: Recent Results (from the past 240 hour(s))  Resp Panel by RT-PCR (Flu A&B, Covid) Nasopharyngeal Swab     Status: None   Collection Time: 06/13/21  4:58 PM   Specimen: Nasopharyngeal Swab; Nasopharyngeal(NP) swabs in vial transport medium  Result Value Ref Range Status   SARS Coronavirus 2 by RT PCR NEGATIVE NEGATIVE Final    Comment: (NOTE) SARS-CoV-2 target nucleic acids are NOT DETECTED.  The SARS-CoV-2 RNA is generally detectable in upper respiratory specimens during the acute phase of infection. The lowest concentration of SARS-CoV-2 viral copies this assay can detect is 138 copies/mL. A negative result does not preclude SARS-Cov-2 infection and should not be used as the sole basis for treatment or other patient management decisions. A negative result may occur with  improper specimen collection/handling, submission of specimen other than nasopharyngeal swab, presence of viral mutation(s) within the areas targeted by this assay, and inadequate number of viral copies(<138 copies/mL). A negative result must be combined with clinical observations, patient history, and epidemiological information. The expected result is Negative.  Fact Sheet for Patients:   EntrepreneurPulse.com.au  Fact Sheet for Healthcare Providers:  IncredibleEmployment.be  This test is no t yet approved or cleared by the [place] FDA and  has been authorized for detection and/or diagnosis of SARS-CoV-2 by FDA under an Emergency Use Authorization (EUA). This EUA will remain  in effect (meaning this test can be used) for  the duration of the COVID-19 declaration under Section 564(b)(1) of the Act, 21 U.S.C.section 360bbb-3(b)(1), unless the authorization is terminated  or revoked sooner.       Influenza A by PCR NEGATIVE NEGATIVE Final   Influenza B by PCR NEGATIVE NEGATIVE Final    Comment: (NOTE) The Xpert Xpress SARS-CoV-2/FLU/RSV plus assay is intended as an aid in the diagnosis of influenza from Nasopharyngeal swab specimens and should not be used as a sole basis for treatment. Nasal washings and aspirates are unacceptable for Xpert Xpress SARS-CoV-2/FLU/RSV testing.  Fact Sheet for Patients: EntrepreneurPulse.com.au  Fact Sheet for Healthcare Providers: IncredibleEmployment.be  This test is not yet approved or cleared by the [place] FDA and has been authorized for detection and/or diagnosis of SARS-CoV-2 by FDA under an Emergency Use Authorization (EUA). This EUA will remain in effect (meaning this test can be used) for the duration of the COVID-19 declaration under Section 564(b)(1) of the Act, 21 U.S.C. section 360bbb-3(b)(1), unless the authorization is terminated or revoked.  Performed at [facility], [place] [address], [place], [place] [address]   Culture, blood (routine x 2)     Status: None (Preliminary result)   Collection Time: 06/13/21  5:03 PM   Specimen: BLOOD  Result Value Ref Range Status   Specimen Description   Final    BLOOD BLOOD LEFT FOREARM Performed at [facility] [address] [place], [place] [address]     Special Requests   Final    BOTTLES DRAWN AEROBIC AND ANAEROBIC Blood Culture adequate volume Performed at [facility] [address], [place], [place] [address]    Culture   Final    NO GROWTH 2 DAYS Performed at [facility] [address], [place], [place] [address]    Report Status PENDING  Incomplete  Culture, blood (routine x 2)     Status: None (Preliminary result)   Collection Time: 06/13/21  5:30 PM   Specimen: BLOOD  Result Value Ref Range Status   Specimen Description   Final    BLOOD BLOOD RIGHT FOREARM Performed at [facility] [address], [place], [place] [address]    Special Requests   Final    BOTTLES DRAWN AEROBIC AND ANAEROBIC Blood Culture results may not be optimal due to an inadequate volume of blood received in culture bottles Performed at [facility] [address] [place], [place] [address]    Culture   Final    NO GROWTH 2 DAYS Performed at [facility] [address] [place],  [address]    Report Status PENDING  Incomplete     Labs: BNP (last 3 results) Recent Labs    06/14/21 1521  BNP 32.3   Basic Metabolic Panel: Recent Labs  Lab 06/13/21 1632 06/14/21 0557 06/14/21 0559 06/15/21 0454  NA 135  --  137 139  K 3.2*  --  3.6 3.8  CL 96*  --  102 103  CO2 29  --  29 30  GLUCOSE 206*  --  100* 103*  BUN 10  --  8 <5*  CREATININE 0.70  --  0.57* 0.54*  CALCIUM 9.3  --  8.4* 8.6*  MG  --  1.9  --  2.0   Liver Function Tests: No results for input(s): AST, ALT, ALKPHOS, BILITOT, PROT, ALBUMIN in the last 168 hours. No results for input(s): LIPASE, AMYLASE in the last 168 hours. No  results for input(s): AMMONIA in the last 168 hours. CBC: Recent Labs  Lab 06/13/21 1632 06/14/21 0559  WBC 13.5* 7.3  NEUTROABS 10.0*  --   HGB 14.3 12.0*  HCT 42.3 36.5*  MCV 88.3 90.1  PLT 235 147*   Cardiac Enzymes: No results for input(s): CKTOTAL, CKMB,  CKMBINDEX, TROPONINI in the last 168 hours. BNP: Invalid input(s): POCBNP CBG: No results for input(s): GLUCAP in the last 168 hours. D-Dimer No results for input(s): DDIMER in the last 72 hours. Hgb A1c No results for input(s): HGBA1C in the last 72 hours. Lipid Profile No results for input(s): CHOL, HDL, LDLCALC, TRIG, CHOLHDL, LDLDIRECT in the last 72 hours. Thyroid function studies Recent Labs    06/14/21 1521  TSH 1.243   Anemia work up No results for input(s): VITAMINB12, FOLATE, FERRITIN, TIBC, IRON, RETICCTPCT in the last 72 hours. Urinalysis    Component Value Date/Time   BILIRUBINUR neg 04/22/2016 1048   PROTEINUR neg 04/22/2016 1048   UROBILINOGEN 0.2 04/22/2016 1048   NITRITE pos 04/22/2016 1048   LEUKOCYTESUR small (1+) (A) 04/22/2016 1048   Sepsis Labs Invalid input(s): PROCALCITONIN,  WBC,  LACTICIDVEN Microbiology Recent Results (from the past 240 hour(s))  Resp Panel by RT-PCR (Flu A&B, Covid) Nasopharyngeal Swab     Status: None   Collection Time: 06/13/21  4:58 PM   Specimen: Nasopharyngeal Swab; Nasopharyngeal(NP) swabs in vial transport medium  Result Value Ref Range Status   SARS Coronavirus 2 by RT PCR NEGATIVE NEGATIVE Final    Comment: (NOTE) SARS-CoV-2 target nucleic acids are NOT DETECTED.  The SARS-CoV-2 RNA is generally detectable in upper respiratory specimens during the acute phase of infection. The lowest concentration of SARS-CoV-2 viral copies this assay can detect is 138 copies/mL. A negative result does not preclude SARS-Cov-2 infection and should not be used as the sole basis for treatment or other patient management decisions. A negative result may occur with  improper specimen collection/handling, submission of specimen other than nasopharyngeal swab, presence of viral mutation(s) within the areas targeted by this assay, and inadequate number of viral copies(<138 copies/mL). A negative result must be combined with clinical  observations, patient history, and epidemiological information. The expected result is Negative.  Fact Sheet for Patients:  EntrepreneurPulse.com.au  Fact Sheet for Healthcare Providers:  IncredibleEmployment.be  This test is no t yet approved or cleared by the [place] FDA and  has been authorized for detection and/or diagnosis of SARS-CoV-2 by FDA under an Emergency Use Authorization (EUA). This EUA will remain  in effect (meaning this test can be used) for the duration of the COVID-19 declaration under Section 564(b)(1) of the Act, 21 U.S.C.section 360bbb-3(b)(1), unless the authorization is terminated  or revoked sooner.       Influenza A by PCR NEGATIVE NEGATIVE Final   Influenza B by PCR NEGATIVE NEGATIVE Final    Comment: (NOTE) The Xpert Xpress SARS-CoV-2/FLU/RSV plus assay is intended as an aid in the diagnosis of influenza from Nasopharyngeal swab specimens and should not be used as a sole basis for treatment. Nasal washings and aspirates are unacceptable for Xpert Xpress SARS-CoV-2/FLU/RSV testing.  Fact Sheet for Patients: EntrepreneurPulse.com.au  Fact Sheet for Healthcare Providers: IncredibleEmployment.be  This test is not yet approved or cleared by the [place] FDA and has been authorized for detection and/or diagnosis of SARS-CoV-2 by FDA under an Emergency Use Authorization (EUA). This EUA will remain in effect (meaning this test can be used) for the duration of the  COVID-19 declaration under Section 564(b)(1) of the Act, 21 U.S.C. section 360bbb-3(b)(1), unless the authorization is terminated or revoked.  Performed at [facility], [place] [address], [place], [place] [address]   Culture, blood (routine x 2)     Status: None (Preliminary result)   Collection Time: 06/13/21  5:03 PM   Specimen: BLOOD  Result Value Ref Range Status   Specimen Description    Final    BLOOD BLOOD LEFT FOREARM Performed at [facility] [address], [place], [place] [address]    Special Requests   Final    BOTTLES DRAWN AEROBIC AND ANAEROBIC Blood Culture adequate volume Performed at [facility] [address], [place], [place] [address]    Culture   Final    NO GROWTH 2 DAYS Performed at [facility] [address], [place], [place] [address]    Report Status PENDING  Incomplete  Culture, blood (routine x 2)     Status: None (Preliminary result)   Collection Time: 06/13/21  5:30 PM   Specimen: BLOOD  Result Value Ref Range Status   Specimen Description   Final    BLOOD BLOOD RIGHT FOREARM Performed at [facility] [address] [place], [place] [address]    Special Requests   Final    BOTTLES DRAWN AEROBIC AND ANAEROBIC Blood Culture results may not be optimal due to an inadequate volume of blood received in culture bottles Performed at [facility] [address], [place], [place] [address]    Culture   Final    NO GROWTH 2 DAYS Performed at [facility] [address], [place], [place] [address]    Report Status PENDING  Incomplete     Time coordinating discharge:  I have spent 35 minutes face to face with the patient and on the ward discussing the patients care, assessment, plan and disposition with other care givers. >50% of the time was devoted counseling the patient about the risks and benefits of treatment/Discharge disposition and coordinating care.   SIGNED:   Damita Lack, MD  [facility] 06/15/2021, 11:07 AM   If 7PM-7AM, please contact night-coverage

## 2021-06-16 DIAGNOSIS — F1120 Opioid dependence, uncomplicated: Secondary | ICD-10-CM | POA: Diagnosis not present

## 2021-06-16 DIAGNOSIS — Z7151 Drug abuse counseling and surveillance of drug abuser: Secondary | ICD-10-CM | POA: Diagnosis not present

## 2021-06-17 LAB — LEGIONELLA PNEUMOPHILA SEROGP 1 UR AG: L. pneumophila Serogp 1 Ur Ag: NEGATIVE

## 2021-06-18 DIAGNOSIS — F1120 Opioid dependence, uncomplicated: Secondary | ICD-10-CM | POA: Diagnosis not present

## 2021-06-18 LAB — CULTURE, BLOOD (ROUTINE X 2)
Culture: NO GROWTH
Culture: NO GROWTH
Special Requests: ADEQUATE

## 2021-06-22 DIAGNOSIS — F1120 Opioid dependence, uncomplicated: Secondary | ICD-10-CM | POA: Diagnosis not present

## 2021-06-23 DIAGNOSIS — F1120 Opioid dependence, uncomplicated: Secondary | ICD-10-CM | POA: Diagnosis not present

## 2021-06-23 DIAGNOSIS — Z7151 Drug abuse counseling and surveillance of drug abuser: Secondary | ICD-10-CM | POA: Diagnosis not present

## 2021-06-30 DIAGNOSIS — Z7151 Drug abuse counseling and surveillance of drug abuser: Secondary | ICD-10-CM | POA: Diagnosis not present

## 2021-06-30 DIAGNOSIS — F1120 Opioid dependence, uncomplicated: Secondary | ICD-10-CM | POA: Diagnosis not present

## 2021-07-07 DIAGNOSIS — Z7151 Drug abuse counseling and surveillance of drug abuser: Secondary | ICD-10-CM | POA: Diagnosis not present

## 2021-07-07 DIAGNOSIS — F1120 Opioid dependence, uncomplicated: Secondary | ICD-10-CM | POA: Diagnosis not present

## 2021-07-20 DIAGNOSIS — Z7151 Drug abuse counseling and surveillance of drug abuser: Secondary | ICD-10-CM | POA: Diagnosis not present

## 2021-07-20 DIAGNOSIS — F1120 Opioid dependence, uncomplicated: Secondary | ICD-10-CM | POA: Diagnosis not present

## 2021-07-28 DIAGNOSIS — F1120 Opioid dependence, uncomplicated: Secondary | ICD-10-CM | POA: Diagnosis not present

## 2021-07-28 DIAGNOSIS — Z7151 Drug abuse counseling and surveillance of drug abuser: Secondary | ICD-10-CM | POA: Diagnosis not present

## 2021-07-29 DIAGNOSIS — F1120 Opioid dependence, uncomplicated: Secondary | ICD-10-CM | POA: Diagnosis not present

## 2021-07-29 DIAGNOSIS — Z03818 Encounter for observation for suspected exposure to other biological agents ruled out: Secondary | ICD-10-CM | POA: Diagnosis not present

## 2021-08-03 ENCOUNTER — Encounter: Payer: Self-pay | Admitting: Gastroenterology

## 2021-08-10 DIAGNOSIS — F1120 Opioid dependence, uncomplicated: Secondary | ICD-10-CM | POA: Diagnosis not present

## 2021-08-10 DIAGNOSIS — Z7151 Drug abuse counseling and surveillance of drug abuser: Secondary | ICD-10-CM | POA: Diagnosis not present

## 2021-08-17 DIAGNOSIS — Z7151 Drug abuse counseling and surveillance of drug abuser: Secondary | ICD-10-CM | POA: Diagnosis not present

## 2021-08-17 DIAGNOSIS — F1120 Opioid dependence, uncomplicated: Secondary | ICD-10-CM | POA: Diagnosis not present

## 2021-08-19 DIAGNOSIS — F1120 Opioid dependence, uncomplicated: Secondary | ICD-10-CM | POA: Diagnosis not present

## 2021-08-19 DIAGNOSIS — Z03818 Encounter for observation for suspected exposure to other biological agents ruled out: Secondary | ICD-10-CM | POA: Diagnosis not present

## 2021-08-24 DIAGNOSIS — Z7151 Drug abuse counseling and surveillance of drug abuser: Secondary | ICD-10-CM | POA: Diagnosis not present

## 2021-08-24 DIAGNOSIS — F1120 Opioid dependence, uncomplicated: Secondary | ICD-10-CM | POA: Diagnosis not present

## 2021-08-31 DIAGNOSIS — F1120 Opioid dependence, uncomplicated: Secondary | ICD-10-CM | POA: Diagnosis not present

## 2021-08-31 DIAGNOSIS — Z7151 Drug abuse counseling and surveillance of drug abuser: Secondary | ICD-10-CM | POA: Diagnosis not present

## 2021-09-07 DIAGNOSIS — Z7151 Drug abuse counseling and surveillance of drug abuser: Secondary | ICD-10-CM | POA: Diagnosis not present

## 2021-09-07 DIAGNOSIS — F1120 Opioid dependence, uncomplicated: Secondary | ICD-10-CM | POA: Diagnosis not present

## 2021-09-14 DIAGNOSIS — F1120 Opioid dependence, uncomplicated: Secondary | ICD-10-CM | POA: Diagnosis not present

## 2021-09-14 DIAGNOSIS — Z7151 Drug abuse counseling and surveillance of drug abuser: Secondary | ICD-10-CM | POA: Diagnosis not present

## 2021-09-21 DIAGNOSIS — F1120 Opioid dependence, uncomplicated: Secondary | ICD-10-CM | POA: Diagnosis not present

## 2021-09-21 DIAGNOSIS — Z7151 Drug abuse counseling and surveillance of drug abuser: Secondary | ICD-10-CM | POA: Diagnosis not present

## 2021-10-02 ENCOUNTER — Encounter (HOSPITAL_COMMUNITY): Payer: Self-pay | Admitting: Radiology

## 2021-10-02 DIAGNOSIS — Z7151 Drug abuse counseling and surveillance of drug abuser: Secondary | ICD-10-CM | POA: Diagnosis not present

## 2021-10-02 DIAGNOSIS — F1120 Opioid dependence, uncomplicated: Secondary | ICD-10-CM | POA: Diagnosis not present

## 2021-10-05 DIAGNOSIS — Z7151 Drug abuse counseling and surveillance of drug abuser: Secondary | ICD-10-CM | POA: Diagnosis not present

## 2021-10-05 DIAGNOSIS — F1120 Opioid dependence, uncomplicated: Secondary | ICD-10-CM | POA: Diagnosis not present

## 2021-10-07 DIAGNOSIS — Z03818 Encounter for observation for suspected exposure to other biological agents ruled out: Secondary | ICD-10-CM | POA: Diagnosis not present

## 2021-10-07 DIAGNOSIS — F1120 Opioid dependence, uncomplicated: Secondary | ICD-10-CM | POA: Diagnosis not present

## 2021-10-13 DIAGNOSIS — M5416 Radiculopathy, lumbar region: Secondary | ICD-10-CM | POA: Diagnosis not present

## 2021-10-13 DIAGNOSIS — M5116 Intervertebral disc disorders with radiculopathy, lumbar region: Secondary | ICD-10-CM | POA: Diagnosis not present

## 2021-10-26 DIAGNOSIS — Z7151 Drug abuse counseling and surveillance of drug abuser: Secondary | ICD-10-CM | POA: Diagnosis not present

## 2021-10-26 DIAGNOSIS — F1120 Opioid dependence, uncomplicated: Secondary | ICD-10-CM | POA: Diagnosis not present

## 2021-10-29 DIAGNOSIS — Z7151 Drug abuse counseling and surveillance of drug abuser: Secondary | ICD-10-CM | POA: Diagnosis not present

## 2021-10-29 DIAGNOSIS — F1120 Opioid dependence, uncomplicated: Secondary | ICD-10-CM | POA: Diagnosis not present

## 2021-10-29 DIAGNOSIS — Z03818 Encounter for observation for suspected exposure to other biological agents ruled out: Secondary | ICD-10-CM | POA: Diagnosis not present

## 2021-11-02 DIAGNOSIS — Z7151 Drug abuse counseling and surveillance of drug abuser: Secondary | ICD-10-CM | POA: Diagnosis not present

## 2021-11-02 DIAGNOSIS — F1120 Opioid dependence, uncomplicated: Secondary | ICD-10-CM | POA: Diagnosis not present

## 2021-11-05 DIAGNOSIS — Z03818 Encounter for observation for suspected exposure to other biological agents ruled out: Secondary | ICD-10-CM | POA: Diagnosis not present

## 2021-11-05 DIAGNOSIS — F1120 Opioid dependence, uncomplicated: Secondary | ICD-10-CM | POA: Diagnosis not present

## 2021-11-09 DIAGNOSIS — F1120 Opioid dependence, uncomplicated: Secondary | ICD-10-CM | POA: Diagnosis not present

## 2021-11-09 DIAGNOSIS — Z7151 Drug abuse counseling and surveillance of drug abuser: Secondary | ICD-10-CM | POA: Diagnosis not present

## 2021-11-12 DIAGNOSIS — F1120 Opioid dependence, uncomplicated: Secondary | ICD-10-CM | POA: Diagnosis not present

## 2021-11-12 DIAGNOSIS — Z7151 Drug abuse counseling and surveillance of drug abuser: Secondary | ICD-10-CM | POA: Diagnosis not present

## 2021-11-13 DIAGNOSIS — M5416 Radiculopathy, lumbar region: Secondary | ICD-10-CM | POA: Diagnosis not present

## 2021-11-16 DIAGNOSIS — F1120 Opioid dependence, uncomplicated: Secondary | ICD-10-CM | POA: Diagnosis not present

## 2021-11-16 DIAGNOSIS — Z7151 Drug abuse counseling and surveillance of drug abuser: Secondary | ICD-10-CM | POA: Diagnosis not present

## 2021-11-25 DIAGNOSIS — F1120 Opioid dependence, uncomplicated: Secondary | ICD-10-CM | POA: Diagnosis not present

## 2021-11-25 DIAGNOSIS — Z7151 Drug abuse counseling and surveillance of drug abuser: Secondary | ICD-10-CM | POA: Diagnosis not present

## 2021-12-09 DIAGNOSIS — Z7151 Drug abuse counseling and surveillance of drug abuser: Secondary | ICD-10-CM | POA: Diagnosis not present

## 2021-12-09 DIAGNOSIS — F1120 Opioid dependence, uncomplicated: Secondary | ICD-10-CM | POA: Diagnosis not present

## 2021-12-10 DIAGNOSIS — Z03818 Encounter for observation for suspected exposure to other biological agents ruled out: Secondary | ICD-10-CM | POA: Diagnosis not present

## 2021-12-10 DIAGNOSIS — F1120 Opioid dependence, uncomplicated: Secondary | ICD-10-CM | POA: Diagnosis not present

## 2022-01-06 DIAGNOSIS — F1120 Opioid dependence, uncomplicated: Secondary | ICD-10-CM | POA: Diagnosis not present

## 2022-01-06 DIAGNOSIS — Z7151 Drug abuse counseling and surveillance of drug abuser: Secondary | ICD-10-CM | POA: Diagnosis not present

## 2022-04-12 DIAGNOSIS — F1120 Opioid dependence, uncomplicated: Secondary | ICD-10-CM | POA: Diagnosis not present

## 2022-04-12 DIAGNOSIS — Z7151 Drug abuse counseling and surveillance of drug abuser: Secondary | ICD-10-CM | POA: Diagnosis not present

## 2022-06-03 DIAGNOSIS — M5116 Intervertebral disc disorders with radiculopathy, lumbar region: Secondary | ICD-10-CM | POA: Diagnosis not present

## 2022-06-03 DIAGNOSIS — M5416 Radiculopathy, lumbar region: Secondary | ICD-10-CM | POA: Diagnosis not present

## 2022-06-16 DIAGNOSIS — Z7151 Drug abuse counseling and surveillance of drug abuser: Secondary | ICD-10-CM | POA: Diagnosis not present

## 2022-06-16 DIAGNOSIS — F1120 Opioid dependence, uncomplicated: Secondary | ICD-10-CM | POA: Diagnosis not present

## 2023-09-16 DIAGNOSIS — R030 Elevated blood-pressure reading, without diagnosis of hypertension: Secondary | ICD-10-CM | POA: Diagnosis not present

## 2023-09-16 DIAGNOSIS — M7989 Other specified soft tissue disorders: Secondary | ICD-10-CM | POA: Diagnosis not present

## 2023-09-28 DIAGNOSIS — Z6826 Body mass index (BMI) 26.0-26.9, adult: Secondary | ICD-10-CM | POA: Diagnosis not present

## 2023-09-28 DIAGNOSIS — M5416 Radiculopathy, lumbar region: Secondary | ICD-10-CM | POA: Diagnosis not present

## 2023-10-05 ENCOUNTER — Other Ambulatory Visit: Payer: Self-pay | Admitting: Family Medicine

## 2023-10-05 DIAGNOSIS — F419 Anxiety disorder, unspecified: Secondary | ICD-10-CM | POA: Diagnosis not present

## 2023-10-05 DIAGNOSIS — R911 Solitary pulmonary nodule: Secondary | ICD-10-CM

## 2023-10-05 DIAGNOSIS — F17210 Nicotine dependence, cigarettes, uncomplicated: Secondary | ICD-10-CM | POA: Diagnosis not present

## 2023-10-05 DIAGNOSIS — Z8679 Personal history of other diseases of the circulatory system: Secondary | ICD-10-CM | POA: Diagnosis not present

## 2023-10-05 DIAGNOSIS — F341 Dysthymic disorder: Secondary | ICD-10-CM | POA: Diagnosis not present

## 2024-06-15 DIAGNOSIS — H10022 Other mucopurulent conjunctivitis, left eye: Secondary | ICD-10-CM | POA: Diagnosis not present
